# Patient Record
Sex: Female | Born: 1940
Health system: Southern US, Community
[De-identification: ages and names within clinical notes are randomized; demographics above are authoritative.]

## PROBLEM LIST (undated history)

## (undated) DIAGNOSIS — C4490 Unspecified malignant neoplasm of skin, unspecified: Secondary | ICD-10-CM

## (undated) DIAGNOSIS — G20 Parkinson's disease: Secondary | ICD-10-CM

## (undated) DIAGNOSIS — N390 Urinary tract infection, site not specified: Secondary | ICD-10-CM

## (undated) DIAGNOSIS — W101XXS Fall (on)(from) sidewalk curb, sequela: Secondary | ICD-10-CM

## (undated) DIAGNOSIS — I712 Thoracic aortic aneurysm, without rupture, unspecified: Secondary | ICD-10-CM

## (undated) DIAGNOSIS — I10 Essential (primary) hypertension: Secondary | ICD-10-CM

## (undated) DIAGNOSIS — M810 Age-related osteoporosis without current pathological fracture: Secondary | ICD-10-CM

## (undated) DIAGNOSIS — G20A1 Parkinson's disease without dyskinesia, without mention of fluctuations: Secondary | ICD-10-CM

## (undated) DIAGNOSIS — E785 Hyperlipidemia, unspecified: Secondary | ICD-10-CM

## (undated) DIAGNOSIS — K219 Gastro-esophageal reflux disease without esophagitis: Secondary | ICD-10-CM

## (undated) DIAGNOSIS — F0280 Dementia in other diseases classified elsewhere without behavioral disturbance: Secondary | ICD-10-CM

## (undated) DIAGNOSIS — I639 Cerebral infarction, unspecified: Secondary | ICD-10-CM

## (undated) DIAGNOSIS — E039 Hypothyroidism, unspecified: Secondary | ICD-10-CM

## (undated) DIAGNOSIS — E538 Deficiency of other specified B group vitamins: Secondary | ICD-10-CM

## (undated) DIAGNOSIS — E119 Type 2 diabetes mellitus without complications: Secondary | ICD-10-CM

## (undated) DIAGNOSIS — IMO0002 Reserved for concepts with insufficient information to code with codable children: Secondary | ICD-10-CM

## (undated) DIAGNOSIS — J309 Allergic rhinitis, unspecified: Secondary | ICD-10-CM

## (undated) DIAGNOSIS — M1990 Unspecified osteoarthritis, unspecified site: Secondary | ICD-10-CM

## (undated) HISTORY — DX: Parkinson's disease: G20

## (undated) HISTORY — DX: Urinary tract infection, site not specified: N39.0

## (undated) HISTORY — DX: Hypothyroidism, unspecified: E03.9

## (undated) HISTORY — DX: Allergic rhinitis, unspecified: J30.9

## (undated) HISTORY — DX: Unspecified osteoarthritis, unspecified site: M19.90

## (undated) HISTORY — DX: Dementia in other diseases classified elsewhere, unspecified severity, without behavioral disturbance, psychotic disturbance, mood disturbance, and anxiety: F02.80

## (undated) HISTORY — DX: Essential (primary) hypertension: I10

## (undated) HISTORY — DX: Cerebral infarction, unspecified: I63.9

## (undated) HISTORY — DX: Parkinson's disease without dyskinesia, without mention of fluctuations: G20.A1

## (undated) HISTORY — DX: Gastro-esophageal reflux disease without esophagitis: K21.9

## (undated) HISTORY — DX: Deficiency of other specified B group vitamins: E53.8

## (undated) HISTORY — DX: Fall (on)(from) sidewalk curb, sequela: W10.1XXS

## (undated) HISTORY — DX: Thoracic aortic aneurysm, without rupture: I71.2

## (undated) HISTORY — DX: Age-related osteoporosis without current pathological fracture: M81.0

## (undated) HISTORY — DX: Thoracic aortic aneurysm, without rupture, unspecified: I71.20

## (undated) HISTORY — DX: Type 2 diabetes mellitus without complications: E11.9

## (undated) HISTORY — DX: Unspecified malignant neoplasm of skin, unspecified: C44.90

## (undated) HISTORY — DX: Reserved for concepts with insufficient information to code with codable children: IMO0002

## (undated) HISTORY — DX: Hyperlipidemia, unspecified: E78.5

---

## 1961-10-17 HISTORY — PX: PARTIAL HYSTERECTOMY: SHX80

## 1984-10-17 HISTORY — PX: THYROIDECTOMY: SHX17

## 1999-03-30 ENCOUNTER — Ambulatory Visit (HOSPITAL_COMMUNITY): Admission: RE | Admit: 1999-03-30 | Discharge: 1999-03-30 | Payer: Self-pay | Admitting: Urology

## 2000-08-30 ENCOUNTER — Other Ambulatory Visit: Admission: RE | Admit: 2000-08-30 | Discharge: 2000-08-30 | Payer: Self-pay | Admitting: Urology

## 2001-10-17 HISTORY — PX: REPLACEMENT TOTAL KNEE: SUR1224

## 2005-10-17 HISTORY — PX: GALLBLADDER SURGERY: SHX652

## 2007-06-11 ENCOUNTER — Ambulatory Visit: Payer: Self-pay | Admitting: Vascular Surgery

## 2007-09-11 ENCOUNTER — Ambulatory Visit: Payer: Self-pay | Admitting: Vascular Surgery

## 2007-11-05 ENCOUNTER — Ambulatory Visit: Payer: Self-pay | Admitting: Vascular Surgery

## 2007-11-12 ENCOUNTER — Ambulatory Visit: Payer: Self-pay | Admitting: Vascular Surgery

## 2007-12-31 ENCOUNTER — Ambulatory Visit: Payer: Self-pay | Admitting: Vascular Surgery

## 2008-01-08 ENCOUNTER — Ambulatory Visit: Payer: Self-pay | Admitting: Vascular Surgery

## 2008-02-28 ENCOUNTER — Ambulatory Visit: Payer: Self-pay | Admitting: Vascular Surgery

## 2008-03-27 ENCOUNTER — Ambulatory Visit: Payer: Self-pay | Admitting: Vascular Surgery

## 2008-07-15 ENCOUNTER — Ambulatory Visit: Payer: Self-pay | Admitting: Vascular Surgery

## 2008-12-18 ENCOUNTER — Ambulatory Visit: Payer: Self-pay | Admitting: Internal Medicine

## 2008-12-18 DIAGNOSIS — R0609 Other forms of dyspnea: Secondary | ICD-10-CM

## 2008-12-18 DIAGNOSIS — Z8679 Personal history of other diseases of the circulatory system: Secondary | ICD-10-CM | POA: Insufficient documentation

## 2008-12-18 DIAGNOSIS — E785 Hyperlipidemia, unspecified: Secondary | ICD-10-CM

## 2008-12-18 DIAGNOSIS — I10 Essential (primary) hypertension: Secondary | ICD-10-CM | POA: Insufficient documentation

## 2008-12-18 DIAGNOSIS — E119 Type 2 diabetes mellitus without complications: Secondary | ICD-10-CM

## 2008-12-18 DIAGNOSIS — R0989 Other specified symptoms and signs involving the circulatory and respiratory systems: Secondary | ICD-10-CM

## 2009-03-27 ENCOUNTER — Ambulatory Visit: Payer: Self-pay | Admitting: Internal Medicine

## 2011-03-01 NOTE — Assessment & Plan Note (Signed)
OFFICE VISIT   TERI, LEGACY  DOB:  1940-11-05                                       11/12/2007  CHART#:[id number]   The patient is 1 week post laser ablation of her right greater saphenous  vein with greater than 20 stab phlebectomies involving the lateral calf  and medial thigh.  She has had very little discomfort in the right leg  following her procedure, but has had some mild aching, particularly in  the distal thigh over the greater saphenous vein where some bruising and  swelling has occurred.  This has not been severely tender, however.  She  has had no discomfort in the stab phlebectomy sites below the knee.  She  has had no distal edema and generally states that the leg has been very  comfortable, and she has increased her ambulation.   EXAM:  She has a moderate amount of ecchymosis over the greater  saphenous vein in the distal right thigh with some hematoma beneath this  measuring about 2 to 3 cm in diameter, but which is nontender.  There is  some mild tenderness along the course of the greater saphenous vein from  the saphenofemoral junction to the knee.  I performed a venous duplex  exam and the deep venous system is widely patent with normal flow, and  the saphenous vein is totally occluded from about 2 cm from the  saphenofemoral junction to the knee.  She is reassured regarding these  findings.  Will continue wearing her elastic compression stockings for  the next 2 weeks, and we will schedule her laser ablation procedure with  stab phlebectomies in the contralateral left leg in the near future.   Quita Skye Hart [place], M.D.  Electronically Signed   JDL/MEDQ  D:  11/12/2007  T:  11/13/2007  Job:  [id number]

## 2011-03-01 NOTE — Assessment & Plan Note (Signed)
OFFICE VISIT   BRENTNEY, GOLDBACH  DOB:  04/18/1941                                       07/15/2008  CHART#:[id number]   The patient underwent laser ablation right great saphenous vein January  2009.  Her left great saphenous vein March of 2009.  Multiple stab  phlebectomies.  She also had sclerotherapy in May and June for  telangiectasias around the distal thigh area which appeared following  the laser ablation.  The varicosities and the pain have all resolved.  She does have a few residual telangiectasias on the right distal thigh  and 1 just over the patella on the left but these are quite minor  compared with the previous photographs.  She states she is pleased  because her symptoms have been relieved and dramatically better  cosmetically.  Blood pressure 136/81, heart rate 62.  Venous duplex exam  today reveals widely patent deep venous systems.  Right great saphenous  vein is totally ablated, left great saphenous vein has some minimal flow  which does not appear significant enough to create problems in the  future.  She will return to see [place] on a p.r.n. basis.   Quita Skye Hart [place], M.D.  Electronically Signed   JDL/MEDQ  D:  07/15/2008  T:  07/16/2008  Job:  [id number]

## 2011-03-01 NOTE — Procedures (Signed)
DUPLEX DEEP VENOUS EXAM - LOWER EXTREMITY   INDICATION:  Follow up bilateral greater saphenous vein ablation.   HISTORY:  Edema:  Minimal, near knees.  Trauma/Surgery:  Left greater saphenous vein ablation with stab  phlebectomy on 12/31/07, right greater saphenous vein ablation with stab  phlebectomy on 11/05/07.  Pain:  No.  PE:  No.  Previous DVT:  No.  Anticoagulants:  No.  Other:   DUPLEX EXAM:                CFV   SFV   PopV   PTV   GSV                R  L  R  L  R  L   R  L  R  L  Thrombosis    o  o  o  o  o  o   o  o  +  +  Spontaneous   +  +  +  +  +  +   +  +  0  D  Phasic        +  +  +  +  +  +   +  +  0  D  Augmentation  +  +  +  +  +  +   +  +  0  D  Compressible  +  +  +  +  +  +   +  +  0  P  Competent     +  D  +  +  +  +   +  +  0  0   Legend:  + - yes  o - no  p - partial  D - decreased   IMPRESSION:  1. No evidence of deep venous thrombosis bilaterally.  2. Right greater saphenous vein shows evidence of ablation without      flow from groin to knee.  3. Left greater saphenous vein shows evidence of ablation with areas      of partial and total occlusion.  There is evidence of flow with      reflux; however, the flow is very minimal.    _____________________________  Deanna Woodard, M.D.   AS/MEDQ  D:  07/15/2008  T:  07/15/2008  Job:  [id number]

## 2011-03-01 NOTE — Assessment & Plan Note (Signed)
OFFICE VISIT   Deanna Woodard, Deanna Woodard  DOB:  07/03/1941                                       09/11/2007  CHART#:[id number]   [name] returns today for further follow-up regarding her  symptomatic venous insufficiency of both lower extremities.  She has  diffuse greater saphenous varicosities in both legs with symptoms of  burning, aching, throbbing and itching.  She has been wearing elastic  compression stockings since August of this year with no improvement in  her symptoms.  She does get some mild improvement with elevation of the  legs but it does not completely relieve her symptoms.  She has also  tried ibuprofen and other conservative measures.   On examination, she has excellent femoral, popliteal and dorsalis pedis  pulses in both legs with severe greater saphenous varicosities as  described along the lateral aspect of the right leg in the mid-thigh  down to the ankle and on the medial aspect of the left leg over the  greater saphenous system.  I think she would benefit from laser ablation  of the greater saphenous vein bilaterally with multiple stab  phlebectomies, and we will proceed with pre-certification for that  beginning with the right side.   Quita Skye Hart [place], M.D.  Electronically Signed   JDL/MEDQ  D:  09/11/2007  T:  09/12/2007  Job:  [id number]   cc:   Philemon Kingdom

## 2011-03-01 NOTE — Consult Note (Signed)
VASCULAR SURGERY CONSULTATION   MERON, BOCCHINO G  DOB:  1941-02-27                                       06/11/2007  CHART#:[id number]   HISTORY OF PRESENT ILLNESS:  Ms. Deanna Woodard is a 70 year old healthy female  who has had increasingly symptomatic varicose veins in both lower  extremities over the last several years.  She has noticed increasing  throbbing, aching and burning discomfort in both legs with the right and  left being equally symptomatic.  These symptoms involve the right  lateral thigh and calf as well as the left medial calf and distal thigh  area.  Her symptoms are worse while standing or sitting and slightly  relieved by elevation of the legs.  She has tried taking ibuprofen which  has helped her symptoms, particularly at night.  She also tried wearing  elastic compression stockings (short leg,) but these caused pain just  below the knee at the top of the stocking level and she was unable to  tolerate these.  She has had no bleeding or ulceration, but does have  swelling toward the end of the day in the ankles and feet and also has  noticed increasing bulging discomfort.  She feels that these are  effecting her daily living and would like treatment if at all possible.   PAST MEDICAL HISTORY:  Negative for diabetes, hypertension, coronary  artery disease, hyperlipidemia or COPD.  She does have a history of some  heart valve problems which have been followed in the past.   PAST SURGICAL HISTORY:  Bilateral knee replacement, bilateral  oophorectomy, cholecystectomy, thyroidectomy.   FAMILY HISTORY:  Positive for varicose veins in her mother, coronary  artery disease in her father who died at age 69.  Negative for stroke  and diabetes.   SOCIAL HISTORY:  She is married, has no children and is retired.  She  does not use tobacco or alcohol.   ALLERGIES:  CIPRO, CEPHALOSPORINS, MACRODANTIN, PENICILLIN, SULFA AND  TRIMETHOPRIM, all of which cause a  rash.   PHYSICAL EXAMINATION:  Vital signs:  Blood pressure 140/98, heart rate  60, respirations 14.  General:  She is a healthy appearing female in no  apparent distress, alert and oriented x3.  Neck:  Supple.  3+ carotid  pulses palpable.  No bruits are audible.  There is no palpable  adenopathy in the neck.  Neurologic exam is normal.  Upper extremity  pulses are 3+ bilaterally.  Chest:  Clear to auscultation.  Cardiovascular:  Regular rhythm with no murmurs.  Abdomen:  Soft,  nontender with no palpable masses.  Extremities:  She has 3+ femoral and  popliteal pulses palpable and 2+ dorsalis pedal pulses palpable  bilaterally.  Both legs have venous insufficiency.  On the right side  she has 1+ edema distally with prominent varicosities beginning in the  mid thigh extending down lateral to the knee and into the lateral calf  area with some smaller varicosities medially.  There is no ulceration or  infection noted.  The left leg has a large nest of bulging varicosities  in the medial calf over the greater saphenous system extending up toward  the knee and extending posteriorly into the calf area.  There is also  distal edema on the left with no hyperpigmentation or ulceration.   Venous duplex exam was performed in  our office.  There is reflux in both  greater saphenous systems with the right saphenofemoral junction having  severe reflux and the left side extending up into the proximal thigh  involving the greater saphenous system.  There is no deep venous  thrombosis or evidence of short saphenous reflux.   I think she does have symptomatic varicosities from her greater  saphenous reflux bilaterally.  We have fit her for a pair of long leg  elastic compression stockings which hopefully she will tolerate better  than the short leg stockings.  She will also try elevation, analgesics  and other conservative measures and she will return in three months to  see if there has been any  improvement in her symptomatology.  I think  she would be a good candidate for laser ablation of both greater  saphenous veins with stab phlebectomies if her conservative therapy is  not successful.   Quita Skye Hart [place], M.D.  Electronically Signed  JDL/MEDQ  D:  06/11/2007  T:  06/12/2007  Job:  [id number]   cc:   Philemon Kingdom

## 2011-03-01 NOTE — Assessment & Plan Note (Signed)
OFFICE VISIT   Deanna Woodard, Deanna Woodard  DOB:  06/30/1941                                       01/08/2008  CHART#:[id number]   The patient underwent a laser ablation of her left greater saphenous  vein with multiple stab phlebectomies on March 16.  Returns today for  initial followup.  Has had no discomfort associated with stab  phlebectomies in the calf anteriorly and posteriorly.  She has had some  mild ecchymosis.  She has had some mild discomfort along the site of the  laser ablation in the thigh and into the greater saphenous vein, which  one would expect.  She has had some discomfort associated with the  elastic on the top of the long-leg stocking.  She has had no distal  edema.   I performed a venous duplex exam today and the saphenous vein is totally  occluded from the saphenofemoral junction to the knee with normal flow  in the deep venous system, and no evidence of deep venous obstruction.  She was reassured regarding these problems.  She underwent laser  ablation of her right greater saphenous vein previously on November 05, 2007.  She has had an excellent result as far as the removal of large  bulbus varicosities, which were quite painful.  She has, however,  developed secondary reticular and spider veins in the distal thigh  medially and laterally, which were not present prior to the laser  ablation.  These are causing itching, burning, and stinging discomfort,  which is not relieved by elastic compression.  I have observed these  over the last 2 visits, and they are unchanged today.  These are painful  and have come secondary to the laser ablation procedure, and I feel that  we should proceed with attempts at sclerotherapy to eliminate these  painful areas.  These were photographed today and compared to the  preoperative photographs, and will be sent for precertification for 2  sessions of sclerotherapy in the right leg.  She will also return in 6  months for final followup venous duplex exams bilaterally.   Deanna Woodard, M.D.  Electronically Signed   JDL/MEDQ  D:  01/08/2008  T:  01/09/2008  Job:  [id number]

## 2011-03-01 NOTE — Procedures (Signed)
LOWER EXTREMITY VENOUS REFLUX EXAM   INDICATION:  Bilateral leg varicose veins and pain.   PROCEDURE PERFORMED:  Lower extremity venous exam.   EXAM:  Using color-flow imaging and pulse Doppler spectral analysis, the  right and left common femoral, superficial femoral, popliteal, posterior  tibial, greater and lesser saphenous veins are evaluated.  There is no  evidence suggesting deep venous insufficiency in the right and left  lower extremity.   The right saphenofemoral junction is not competent.  The right and left  GSV is not competent with the caliber as described below.   The right/left proximal short saphenous vein demonstrates competency.   GSV Diameter (used if found to be incompetent only)                                            Right    Left  Proximal Greater Saphenous Vein           0.62. cm 0.50. cm  Proximal-to-mid-thigh                     0.46. cm 0.52. cm  Mid thigh                                 0.46. cm 0.52. cm  Mid-distal thigh                          0.39. cm 0.55. cm  Distal thigh                              0.37. cm 0.55. cm  Knee                                      0.37. cm 0.58. cm   IMPRESSION:  1. Right and left greater saphenous vein reflux is identified with the      caliber ranging from on the right, 0.37 to 0.62 cm knee to groin,      on the left, 0.58 to 0.50 knee to groin.  2. The right and left greater saphenous vein is not aneurysmal.  3. The right and left greater saphenous vein is not tortuous.  4. There is no evidence of significant deep venous insufficiency in      the left lower extremity.  5. The deep venous system is competent.  6. The right and left lesser saphenous vein is competent.  7. There is no evidence of DVT noted in bilateral legs.   ___________________________________________  Quita Skye Hart [place], M.D.   MG/MEDQ  D:  06/11/2007  T:  06/12/2007  Job:  [id number]

## 2014-05-20 ENCOUNTER — Institutional Professional Consult (permissible substitution) (INDEPENDENT_AMBULATORY_CARE_PROVIDER_SITE_OTHER): Payer: Medicare PPO | Admitting: Thoracic Surgery (Cardiothoracic Vascular Surgery)

## 2014-05-20 VITALS — BP 139/79 | HR 60 | Ht 66.0 in | Wt 184.0 lb

## 2014-05-20 DIAGNOSIS — I712 Thoracic aortic aneurysm, without rupture, unspecified: Secondary | ICD-10-CM

## 2014-05-20 DIAGNOSIS — I7121 Aneurysm of the ascending aorta, without rupture: Secondary | ICD-10-CM | POA: Insufficient documentation

## 2014-05-20 NOTE — Progress Notes (Signed)
PCP is PROCHNAU,CAROLINE, MD Referring Provider is Ernestene Kiel, MD  Chief Complaint  Patient presents with  . NEW THORACIC    THORACIC ANEURYSM W/O MENTION OF RUPTURE    HPI: 73 year old woman sent for consultation regarding an ascending thoracic aneurysm.  Deanna Woodard is a 73 year old woman with a history of hypertension, hypercholesterolemia, permanent pacemaker, diabetes, and a "mini stroke." She says that about 6 months ago she was having some chest pains. She says her workup for that was negative for cardiac etiology. She did have a CT at that time to rule out pulmonary embolus. There was no PE. She was noted to have mild diffuse enlargement of her aorta. She had another CT in July which she says was in followup of the previous one. This described a 4 cm ascending thoracic aortic aneurysm.  She says that she is not currently having any issues with chest pain or shortness of breath. She does have leg swelling. She does have some residual expressive aphasia from her "mini stroke" and also has had some memory problems.   Past Medical History  Diagnosis Date  . Hypertension   . Hyperlipidemia   . Hypothyroidism   . Diabetes mellitus, type 2   . Thoracic aneurysm without mention of rupture   . Stroke   . Skin cancer   . UTI (lower urinary tract infection)   . GERD (gastroesophageal reflux disease)   . DDD (degenerative disc disease)   . DJD (degenerative joint disease)   . Vitamin B12 deficiency   . Allergic rhinitis   . Osteoporosis     Past Surgical History  Procedure Laterality Date  . Gallbladder surgery  2007  . Thyroidectomy  1986  . Partial hysterectomy  1963  . Replacement total knee  2003    Family History  Problem Relation Age of Onset  . Hypertension Mother   . Hypertension Father   . Heart disease Father   . Hypertension Sister   . Hiatal hernia Sister   . Coronary artery disease Sister     Social History History  Substance Use Topics  .  Smoking status: Never Smoker   . Smokeless tobacco: Never Used  . Alcohol Use: No    Current Outpatient Prescriptions  Medication Sig Dispense Refill  . aspirin EC 81 MG tablet Take 81 mg by mouth daily.      . Calcium Carb-Cholecalciferol (CALCIUM 600 + D) 600-200 MG-UNIT TABS Take by mouth.      . cholecalciferol (VITAMIN D) 1000 UNITS tablet Take 1,000 Units by mouth daily.      . furosemide (LASIX) 20 MG tablet Take 20 mg by mouth.      Marland Kitchen HYDROcodone-acetaminophen (NORCO/VICODIN) 5-325 MG per tablet Take 1 tablet by mouth every 6 (six) hours as needed for moderate pain.      Marland Kitchen levothyroxine (SYNTHROID, LEVOTHROID) 100 MCG tablet Take 100 mcg by mouth daily before breakfast.      . losartan (COZAAR) 50 MG tablet Take 50 mg by mouth daily.      . metoprolol succinate (TOPROL-XL) 50 MG 24 hr tablet Take 50 mg by mouth daily. Take with or immediately following a meal.      . naproxen sodium (ANAPROX) 220 MG tablet Take 220 mg by mouth 2 (two) times daily with a meal.      . omeprazole (PRILOSEC) 20 MG capsule Take 20 mg by mouth daily.      . pravastatin (PRAVACHOL) 10 MG tablet Take 10 mg  by mouth daily.      Marland Kitchen triamcinolone cream (KENALOG) 0.1 % Apply 1 application topically 2 (two) times daily.       No current facility-administered medications for this visit.    Allergies  Allergen Reactions  . Penicillins     REACTION: rash  . Sulfonamide Derivatives     REACTION: rash    Review of Systems  Constitutional: Negative for activity change.  Eyes:       Blurry vision  Respiratory: Negative for cough and shortness of breath.   Cardiovascular: Positive for chest pain (6 months ago, none recently), palpitations (permanent pacemaker placed during the ablation procedure) and leg swelling.       Varicose veins, status post ablation  Musculoskeletal:       Leg cramps  Neurological: Negative for weakness.       Memory problems, mild expressive aphasia  All other systems reviewed and  are negative.   BP 139/79  Pulse 60  Ht 5\' 6"  (1.676 m)  Wt 184 lb (83.462 kg)  BMI 29.71 kg/m2  SpO2 97% Physical Exam  Vitals reviewed. Constitutional: She is oriented to person, place, and time. She appears well-developed and well-nourished. No distress.  HENT:  Head: Normocephalic and atraumatic.  Eyes: EOM are normal. Pupils are equal, round, and reactive to light.  Neck: Neck supple. No thyromegaly present.  No carotid bruits  Cardiovascular: Normal rate and normal heart sounds.  Exam reveals no gallop and no friction rub.   No murmur heard. Slightly irregular rhythm  Pulmonary/Chest: Effort normal and breath sounds normal. She has no wheezes. She has no rales.  Abdominal: Soft. There is no tenderness.  Musculoskeletal: She exhibits edema (1+ bilaterally).  Lymphadenopathy:    She has no cervical adenopathy.  Neurological: She is alert and oriented to person, place, and time. No cranial nerve deficit.  Mild expressive aphasia  Skin: Skin is warm and dry.     Diagnostic Tests: CT ANGIOGRAPHY CHEST  CT ABDOMEN AND PELVIS WITH CONTRAST  TECHNIQUE: Multidetector CT imaging of the chest was performed using the standard protocol during bolus administration of intravenous contrast. Multiplanar CT image reconstructions and MIPs were obtained to evaluate the vascular anatomy. Multidetector CT imaging of the abdomen and pelvis was performed using the standard protocol during bolus administration of intravenous contrast.  CONTRAST: 100 cc of Isovue 370  COMPARISON: None.  FINDINGS: CTA CHEST FINDINGS  There is no pleural effusion identified. No airspace consolidation or atelectasis identified. Small nodule in the right lower lobe measures 3 mm, image 59/series 8.  There is mild cardiac enlargement. No pericardial effusion. Left chest wall pacer device is identified with leads in the right atrial appendage and right ventricle. No pathologically enlarged mediastinal  or hilar lymph nodes identified. There is no axillary or supraclavicular adenopathy. The ascending thoracic aorta measures 4 cm in diameter, image 110/series 3. At the level of the aortic arch the thoracic aorta measures 2.5 cm. The descending thoracic aorta measures up to 2.3 cm.  That the bones appear osteopenic. Mild compression fractures are identified at T6 and T10. There is mild multi level disc space narrowing and ventral endplate spurring noted. No aggressive appearing the lytic or sclerotic bone lesions identified.  CT ABDOMEN and PELVIS FINDINGS  There is no focal liver abnormality identified. Previous cholecystectomy. No significant biliary dilatation. Normal appearance of the pancreas. The spleen is normal.  Normal appearance of the right adrenal gland. No nodule. Similar appearance of mild asymmetric  enlargement of the left adrenal gland without discrete nodule or mass. Bilateral pelvocaliectasis noted. This appears similar to previous exam. The kidneys are otherwise unremarkable.  Normal caliber of the abdominal aorta. There is mild calcified atherosclerotic disease noted. No aneurysm. No upper abdominal retroperitoneal adenopathy. No enlarged mesenteric nodes.  The stomach is within normal limits. The small bowel loops appear normal. Normal appearance of the colon.  Review of the visualized osseous structures is significant for mild multilevel degenerative disc disease. This is most severe at L1-2.  Review of the MIP images confirms the above findings.  IMPRESSION: 1. Mild aneurysmal dilatation of the ascending aorta which measures 4 cm. 2. Right lower lobe pulmonary nodule measures 3 mm. If the patient is at high risk for bronchogenic carcinoma, follow-up chest CT at 1 year is recommended. If the patient is at low risk, no follow-up is needed. This recommendation follows the consensus statement: Guidelines for Management of Small Pulmonary Nodules Detected on  CT Scans: A Statement from the [employer] as published in Radiology 2005; 237:395-400. 3. Mild T6 and T7 compression deformities. 4. Thoracic and lumbar degenerative disc disease. 5. Atherosclerotic disease. 6. Stable mild asymmetric enlargement of the left adrenal gland.   Electronically Signed By: Kerby Moors M.D. On: 05/06/2014 13:44   Impression: 73 year old woman with a history of hypertension, hypercholesterolemia, diabetes who was incidentally found to have a 4 cm descending aorta on the CT back in January. A followup CT in 6 months shows no change in the size of the aorta. Interestingly the radiologist in January so diffuse aortic enlargement and the radiologist in July says aneurysm. Comparison to study side-by-side there is no difference.  There is no indication for surgery. Typically in the ascending aorta we recommend surgery when the aneurysm gets to about 5.5 cm in diameter.  She also questions about the etiology of the aneurysm and the things that she could do to try to prevent it from expanding. I discussed the importance of blood pressure control with her. Her blood pressure is borderline elevated today, which is not surprising given her anxiety about the diagnosis. She is on a good medical regimen including metoprolol and Cozaar.  Her ascending aorta is definitely enlarged and borderline aneurysmal. I think she can safely be followed on an annual basis for now. Given that the radiologist also noted a 3 mm lung nodule in the right lower lobe will plan to follow her with CTs initially.  Plan: Return with CT angio of chest in one year

## 2014-10-17 DIAGNOSIS — W101XXS Fall (on)(from) sidewalk curb, sequela: Secondary | ICD-10-CM

## 2014-10-17 HISTORY — DX: Fall (on)(from) sidewalk curb, sequela: W10.1XXS

## 2014-12-22 DIAGNOSIS — Z961 Presence of intraocular lens: Secondary | ICD-10-CM | POA: Diagnosis not present

## 2015-02-11 DIAGNOSIS — S92352A Displaced fracture of fifth metatarsal bone, left foot, initial encounter for closed fracture: Secondary | ICD-10-CM | POA: Diagnosis not present

## 2015-02-11 DIAGNOSIS — S20212A Contusion of left front wall of thorax, initial encounter: Secondary | ICD-10-CM | POA: Diagnosis not present

## 2015-02-11 DIAGNOSIS — I10 Essential (primary) hypertension: Secondary | ICD-10-CM | POA: Diagnosis not present

## 2015-02-11 DIAGNOSIS — Z7901 Long term (current) use of anticoagulants: Secondary | ICD-10-CM | POA: Diagnosis not present

## 2015-02-11 DIAGNOSIS — S5002XA Contusion of left elbow, initial encounter: Secondary | ICD-10-CM | POA: Diagnosis not present

## 2015-02-11 DIAGNOSIS — Z79899 Other long term (current) drug therapy: Secondary | ICD-10-CM | POA: Diagnosis not present

## 2015-04-06 DIAGNOSIS — R4701 Aphasia: Secondary | ICD-10-CM | POA: Diagnosis not present

## 2015-04-06 DIAGNOSIS — R41841 Cognitive communication deficit: Secondary | ICD-10-CM | POA: Diagnosis not present

## 2015-04-06 DIAGNOSIS — F800 Phonological disorder: Secondary | ICD-10-CM | POA: Diagnosis not present

## 2015-04-10 DIAGNOSIS — R41841 Cognitive communication deficit: Secondary | ICD-10-CM | POA: Diagnosis not present

## 2015-04-10 DIAGNOSIS — F800 Phonological disorder: Secondary | ICD-10-CM | POA: Diagnosis not present

## 2015-04-10 DIAGNOSIS — R4701 Aphasia: Secondary | ICD-10-CM | POA: Diagnosis not present

## 2015-04-27 ENCOUNTER — Other Ambulatory Visit: Payer: Self-pay | Admitting: *Deleted

## 2015-04-27 DIAGNOSIS — I719 Aortic aneurysm of unspecified site, without rupture: Secondary | ICD-10-CM

## 2015-04-27 DIAGNOSIS — R911 Solitary pulmonary nodule: Secondary | ICD-10-CM

## 2015-04-29 DIAGNOSIS — S92352A Displaced fracture of fifth metatarsal bone, left foot, initial encounter for closed fracture: Secondary | ICD-10-CM | POA: Diagnosis not present

## 2015-05-01 DIAGNOSIS — M8589 Other specified disorders of bone density and structure, multiple sites: Secondary | ICD-10-CM | POA: Diagnosis not present

## 2015-05-01 DIAGNOSIS — M810 Age-related osteoporosis without current pathological fracture: Secondary | ICD-10-CM | POA: Diagnosis not present

## 2015-05-01 DIAGNOSIS — S92902A Unspecified fracture of left foot, initial encounter for closed fracture: Secondary | ICD-10-CM | POA: Diagnosis not present

## 2015-05-07 DIAGNOSIS — E039 Hypothyroidism, unspecified: Secondary | ICD-10-CM | POA: Diagnosis not present

## 2015-05-07 DIAGNOSIS — E119 Type 2 diabetes mellitus without complications: Secondary | ICD-10-CM | POA: Diagnosis not present

## 2015-05-07 DIAGNOSIS — I10 Essential (primary) hypertension: Secondary | ICD-10-CM | POA: Diagnosis not present

## 2015-05-07 DIAGNOSIS — E785 Hyperlipidemia, unspecified: Secondary | ICD-10-CM | POA: Diagnosis not present

## 2015-05-07 DIAGNOSIS — K219 Gastro-esophageal reflux disease without esophagitis: Secondary | ICD-10-CM | POA: Diagnosis not present

## 2015-05-07 DIAGNOSIS — I480 Paroxysmal atrial fibrillation: Secondary | ICD-10-CM | POA: Diagnosis not present

## 2015-05-18 DIAGNOSIS — I719 Aortic aneurysm of unspecified site, without rupture: Secondary | ICD-10-CM | POA: Diagnosis not present

## 2015-05-18 DIAGNOSIS — R911 Solitary pulmonary nodule: Secondary | ICD-10-CM | POA: Diagnosis not present

## 2015-05-18 LAB — BUN: BUN: 20 mg/dL (ref 7–25)

## 2015-05-18 LAB — CREATININE, SERUM: Creat: 1 mg/dL — ABNORMAL HIGH (ref 0.60–0.93)

## 2015-05-19 DIAGNOSIS — F800 Phonological disorder: Secondary | ICD-10-CM | POA: Diagnosis not present

## 2015-05-19 DIAGNOSIS — R4701 Aphasia: Secondary | ICD-10-CM | POA: Diagnosis not present

## 2015-05-19 DIAGNOSIS — R41841 Cognitive communication deficit: Secondary | ICD-10-CM | POA: Diagnosis not present

## 2015-05-25 DIAGNOSIS — H02831 Dermatochalasis of right upper eyelid: Secondary | ICD-10-CM | POA: Diagnosis not present

## 2015-05-25 DIAGNOSIS — H04123 Dry eye syndrome of bilateral lacrimal glands: Secondary | ICD-10-CM | POA: Diagnosis not present

## 2015-05-25 DIAGNOSIS — Z961 Presence of intraocular lens: Secondary | ICD-10-CM | POA: Diagnosis not present

## 2015-05-25 DIAGNOSIS — H02834 Dermatochalasis of left upper eyelid: Secondary | ICD-10-CM | POA: Diagnosis not present

## 2015-05-26 ENCOUNTER — Encounter: Payer: Self-pay | Admitting: Thoracic Surgery (Cardiothoracic Vascular Surgery)

## 2015-05-26 ENCOUNTER — Ambulatory Visit
Admission: RE | Admit: 2015-05-26 | Discharge: 2015-05-26 | Disposition: A | Discharge status: Home / Self Care | Payer: Medicare PPO | Source: Ambulatory Visit | Attending: Thoracic Surgery (Cardiothoracic Vascular Surgery) | Admitting: Thoracic Surgery (Cardiothoracic Vascular Surgery)

## 2015-05-26 ENCOUNTER — Ambulatory Visit (INDEPENDENT_AMBULATORY_CARE_PROVIDER_SITE_OTHER): Payer: Medicare PPO | Admitting: Thoracic Surgery (Cardiothoracic Vascular Surgery)

## 2015-05-26 VITALS — BP 145/79 | HR 60 | Resp 20 | Ht 66.0 in | Wt 186.0 lb

## 2015-05-26 DIAGNOSIS — F800 Phonological disorder: Secondary | ICD-10-CM | POA: Diagnosis not present

## 2015-05-26 DIAGNOSIS — R911 Solitary pulmonary nodule: Secondary | ICD-10-CM

## 2015-05-26 DIAGNOSIS — I712 Thoracic aortic aneurysm, without rupture: Secondary | ICD-10-CM

## 2015-05-26 DIAGNOSIS — I719 Aortic aneurysm of unspecified site, without rupture: Secondary | ICD-10-CM

## 2015-05-26 DIAGNOSIS — I7121 Aneurysm of the ascending aorta, without rupture: Secondary | ICD-10-CM

## 2015-05-26 DIAGNOSIS — I700 Atherosclerosis of aorta: Secondary | ICD-10-CM | POA: Diagnosis not present

## 2015-05-26 DIAGNOSIS — R41841 Cognitive communication deficit: Secondary | ICD-10-CM | POA: Diagnosis not present

## 2015-05-26 DIAGNOSIS — R4701 Aphasia: Secondary | ICD-10-CM | POA: Diagnosis not present

## 2015-05-26 MED ORDER — IOPAMIDOL (ISOVUE-370) INJECTION 76%
75.0000 mL | Freq: Once | INTRAVENOUS | Status: AC | PRN
Start: 2015-05-26 — End: 2015-05-26
  Administered 2015-05-26: 75 mL via INTRAVENOUS

## 2015-05-26 NOTE — Progress Notes (Signed)
[address]       [place] [address]             [phone]      HPI: Deanna Woodard returns for a scheduled 1 year follow-up visit.  She is a 74 year old woman with hypertension, hyperlipidemia, type 2 diabetes, atrial fibrillation, and expressive aphasia from a stroke. She was found to have a 4 cm ascending aortic aneurysm about a year ago. She was asymptomatic and we recommended repeat scan in 1 year. She now returns for that.  She has been having more difficulty with her expressive aphasia recently. She's been working with a therapist on that, but is not seeing much progress. She has an appointment with a neurologist in the next couple of weeks. She wore an event monitor for a month and now is on Eliquis for atrial fibrillation. She has not had any chest pain, but does get short of breath with moderate exertion.  Past Medical History  Diagnosis Date  . Hypertension   . Hyperlipidemia   . Hypothyroidism   . Diabetes mellitus, type 2   . Thoracic aneurysm without mention of rupture   . Stroke   . Skin cancer   . UTI (lower urinary tract infection)   . GERD (gastroesophageal reflux disease)   . DDD (degenerative disc disease)   . DJD (degenerative joint disease)   . Vitamin B12 deficiency   . Allergic rhinitis   . Osteoporosis       Current Outpatient Prescriptions  Medication Sig Dispense Refill  . apixaban (ELIQUIS) 5 MG TABS tablet Take 5 mg by mouth 2 (two) times daily.    . cholecalciferol (VITAMIN D) 1000 UNITS tablet Take 1,000 Units by mouth daily.    . furosemide (LASIX) 20 MG tablet Take 20 mg by mouth.    . levothyroxine (SYNTHROID, LEVOTHROID) 100 MCG tablet Take 100 mcg by mouth daily before breakfast.    . losartan (COZAAR) 50 MG tablet Take 50 mg by mouth daily.    . metoprolol succinate (TOPROL-XL) 50 MG 24 hr tablet Take 50 mg by mouth daily. Take with or immediately following a meal.    . omeprazole (PRILOSEC) 20 MG capsule Take 20  mg by mouth daily.    . pravastatin (PRAVACHOL) 10 MG tablet Take 10 mg by mouth daily.     No current facility-administered medications for this visit.    Physical Exam BP 145/79 mmHg  Pulse 60  Resp 20  Ht 5\' 6"  (1.676 m)  Wt 186 lb (84.369 kg)  BMI 30.04 kg/m2  SpO89 64% 74 year old woman in no acute distress Well-developed, obese Alert and oriented 3, expressive aphasia No carotid bruits Cardiac irregularly irregular, no murmur Lungs clear with breath sounds bilaterally Pulses 2+ and symmetrical  Diagnostic Tests: CT ANGIOGRAPHY CHEST WITH CONTRAST  TECHNIQUE: Multidetector CT imaging of the chest was performed using the standard protocol during bolus administration of intravenous contrast. Multiplanar CT image reconstructions and MIPs were obtained to evaluate the vascular anatomy.  CONTRAST: 75 mL of Isovue 370 intravenously.  COMPARISON: CT scan of May 06, 2014.  FINDINGS: Stable old compression deformities are noted in the thoracic spine. No pneumothorax or pleural effusion is noted. Stable 3 mm nodule is noted in the lateral portion of the right lower lobe. Mild biapical scarring is noted. No acute pulmonary disease is noted.  4.1 cm ascending thoracic aortic aneurysm is noted which is not significantly changed compared to prior  exam. Great vessels are widely patent without significant stenosis. No dissection is noted. Atherosclerosis of thoracic aorta is noted. Significant mediastinal adenopathy is noted. Enlarged right thyroid gland is noted. Visualized portion of upper abdomen appears normal.  Review of the MIP images confirms the above findings.  IMPRESSION: Stable 4.1 cm ascending thoracic aortic aneurysm. Recommend annual imaging followup by CTA or MRA. This recommendation follows 2010 ACCF/AHA/AATS/ACR/ASA/SCA/SCAI/SIR/STS/SVM Guidelines for the Diagnosis and Management of Patients with Thoracic Aortic Disease. Circulation. 2010; 121:  W929-V747.  Stable 3 mm nodule is noted in right lower lobe. This can be considered benign at this point with no further follow-up required.   Electronically Signed  By: Deanna Woodard, M.D.  On: 05/26/2015 14:24  Impression: 74 year old woman with multiple medical issues who has a 4.1 cm ascending aortic aneurysm. This is stable over the past year. There is no indication for surgical intervention at this time. She does need continued follow-up.  She also has a 3 mm nodule in the right lower lobe that is unchanged over the past year.  Her blood pressure was mildly elevated today at 145/79. It was normal 2 weeks ago when she saw Dr. Laqueta Due. I suspect that it was elevated today due to going for a CT and then rushing to the office. I did not make any medication changes  Plan:  Follow-up with Dr. Laqueta Due regarding BP  I will see her back in one year with a CT angiogram of the chest.  Deanna Nakayama, MD [facility] [phone]

## 2015-06-04 DIAGNOSIS — F800 Phonological disorder: Secondary | ICD-10-CM | POA: Diagnosis not present

## 2015-06-09 DIAGNOSIS — E785 Hyperlipidemia, unspecified: Secondary | ICD-10-CM | POA: Diagnosis not present

## 2015-06-09 DIAGNOSIS — Z79899 Other long term (current) drug therapy: Secondary | ICD-10-CM | POA: Diagnosis not present

## 2015-06-09 DIAGNOSIS — R4701 Aphasia: Secondary | ICD-10-CM | POA: Diagnosis not present

## 2015-06-09 DIAGNOSIS — R41841 Cognitive communication deficit: Secondary | ICD-10-CM | POA: Diagnosis not present

## 2015-06-09 DIAGNOSIS — I10 Essential (primary) hypertension: Secondary | ICD-10-CM | POA: Diagnosis not present

## 2015-06-09 DIAGNOSIS — E119 Type 2 diabetes mellitus without complications: Secondary | ICD-10-CM | POA: Diagnosis not present

## 2015-06-09 DIAGNOSIS — F800 Phonological disorder: Secondary | ICD-10-CM | POA: Diagnosis not present

## 2015-06-10 DIAGNOSIS — G311 Senile degeneration of brain, not elsewhere classified: Secondary | ICD-10-CM | POA: Diagnosis not present

## 2015-06-10 DIAGNOSIS — R479 Unspecified speech disturbances: Secondary | ICD-10-CM | POA: Diagnosis not present

## 2015-06-10 DIAGNOSIS — R4789 Other speech disturbances: Secondary | ICD-10-CM | POA: Diagnosis not present

## 2015-06-16 DIAGNOSIS — R4701 Aphasia: Secondary | ICD-10-CM | POA: Diagnosis not present

## 2015-06-16 DIAGNOSIS — F800 Phonological disorder: Secondary | ICD-10-CM | POA: Diagnosis not present

## 2015-06-16 DIAGNOSIS — R41841 Cognitive communication deficit: Secondary | ICD-10-CM | POA: Diagnosis not present

## 2015-06-17 DIAGNOSIS — F800 Phonological disorder: Secondary | ICD-10-CM | POA: Diagnosis not present

## 2015-06-18 DIAGNOSIS — R4701 Aphasia: Secondary | ICD-10-CM | POA: Diagnosis not present

## 2015-06-18 DIAGNOSIS — R41841 Cognitive communication deficit: Secondary | ICD-10-CM | POA: Diagnosis not present

## 2015-06-23 DIAGNOSIS — D510 Vitamin B12 deficiency anemia due to intrinsic factor deficiency: Secondary | ICD-10-CM | POA: Diagnosis not present

## 2015-06-23 DIAGNOSIS — R41841 Cognitive communication deficit: Secondary | ICD-10-CM | POA: Diagnosis not present

## 2015-06-23 DIAGNOSIS — R800 Isolated proteinuria: Secondary | ICD-10-CM | POA: Diagnosis not present

## 2015-06-23 DIAGNOSIS — R5383 Other fatigue: Secondary | ICD-10-CM | POA: Diagnosis not present

## 2015-06-23 DIAGNOSIS — G609 Hereditary and idiopathic neuropathy, unspecified: Secondary | ICD-10-CM | POA: Diagnosis not present

## 2015-06-23 DIAGNOSIS — R4701 Aphasia: Secondary | ICD-10-CM | POA: Diagnosis not present

## 2015-06-23 DIAGNOSIS — F800 Phonological disorder: Secondary | ICD-10-CM | POA: Diagnosis not present

## 2015-06-23 DIAGNOSIS — A539 Syphilis, unspecified: Secondary | ICD-10-CM | POA: Diagnosis not present

## 2015-06-29 DIAGNOSIS — S92352A Displaced fracture of fifth metatarsal bone, left foot, initial encounter for closed fracture: Secondary | ICD-10-CM | POA: Diagnosis not present

## 2015-07-01 DIAGNOSIS — F800 Phonological disorder: Secondary | ICD-10-CM | POA: Diagnosis not present

## 2015-08-24 DIAGNOSIS — R4701 Aphasia: Secondary | ICD-10-CM | POA: Diagnosis not present

## 2015-08-24 DIAGNOSIS — M7989 Other specified soft tissue disorders: Secondary | ICD-10-CM | POA: Diagnosis not present

## 2015-08-25 DIAGNOSIS — Z471 Aftercare following joint replacement surgery: Secondary | ICD-10-CM | POA: Diagnosis not present

## 2015-08-25 DIAGNOSIS — Z96653 Presence of artificial knee joint, bilateral: Secondary | ICD-10-CM | POA: Diagnosis not present

## 2015-08-31 DIAGNOSIS — M7989 Other specified soft tissue disorders: Secondary | ICD-10-CM | POA: Diagnosis not present

## 2015-08-31 DIAGNOSIS — E119 Type 2 diabetes mellitus without complications: Secondary | ICD-10-CM | POA: Diagnosis not present

## 2015-08-31 DIAGNOSIS — I4891 Unspecified atrial fibrillation: Secondary | ICD-10-CM | POA: Diagnosis not present

## 2015-08-31 DIAGNOSIS — Z7901 Long term (current) use of anticoagulants: Secondary | ICD-10-CM | POA: Diagnosis not present

## 2015-08-31 DIAGNOSIS — I872 Venous insufficiency (chronic) (peripheral): Secondary | ICD-10-CM | POA: Diagnosis not present

## 2015-08-31 DIAGNOSIS — I10 Essential (primary) hypertension: Secondary | ICD-10-CM | POA: Diagnosis not present

## 2015-08-31 DIAGNOSIS — Z950 Presence of cardiac pacemaker: Secondary | ICD-10-CM | POA: Diagnosis not present

## 2015-09-07 DIAGNOSIS — R4701 Aphasia: Secondary | ICD-10-CM | POA: Diagnosis not present

## 2015-09-07 DIAGNOSIS — I480 Paroxysmal atrial fibrillation: Secondary | ICD-10-CM | POA: Diagnosis not present

## 2015-09-07 DIAGNOSIS — E785 Hyperlipidemia, unspecified: Secondary | ICD-10-CM | POA: Diagnosis not present

## 2015-09-07 DIAGNOSIS — E119 Type 2 diabetes mellitus without complications: Secondary | ICD-10-CM | POA: Diagnosis not present

## 2015-09-07 DIAGNOSIS — Z79899 Other long term (current) drug therapy: Secondary | ICD-10-CM | POA: Diagnosis not present

## 2015-09-07 DIAGNOSIS — E039 Hypothyroidism, unspecified: Secondary | ICD-10-CM | POA: Diagnosis not present

## 2015-09-07 DIAGNOSIS — I10 Essential (primary) hypertension: Secondary | ICD-10-CM | POA: Diagnosis not present

## 2015-11-06 ENCOUNTER — Encounter: Payer: Self-pay | Admitting: Vascular Surgery

## 2015-11-09 ENCOUNTER — Encounter: Payer: Self-pay | Admitting: Vascular Surgery

## 2015-11-17 ENCOUNTER — Encounter: Payer: Self-pay | Admitting: Vascular Surgery

## 2015-11-17 ENCOUNTER — Ambulatory Visit (INDEPENDENT_AMBULATORY_CARE_PROVIDER_SITE_OTHER): Payer: Medicare Other | Admitting: Vascular Surgery

## 2015-11-17 VITALS — BP 139/79 | HR 60 | Temp 97.6°F | Resp 16 | Ht 68.0 in | Wt 184.0 lb

## 2015-11-17 DIAGNOSIS — I83892 Varicose veins of left lower extremities with other complications: Secondary | ICD-10-CM | POA: Diagnosis not present

## 2015-11-17 NOTE — Progress Notes (Signed)
Subjective:     Patient ID: Deanna Woodard, female   DOB: September 03, 1941, 75 y.o.   MRN: [id number]  HPI this 75 year old female is known to me having previously undergone bilateral great saphenous vein laser ablation procedures in 2009 for painful varicosities. She has done well since that time with residual varicosities being minimal. Unfortunately she began developing progressive edema in the left leg about 18 months ago. She also had a fracture of her left ankle about 9 months ago which did not require surgery. She has no history of DVT thrombophlebitis stasis ulcers or bleeding. She is not wearing elastic compression stockings. She is developing aching throbbing and burning discomfort in the left leg which worsens as the day progresses and her edema worsens as well. Right leg is not having much in the way of symptoms.  Past Medical History  Diagnosis Date  . Hypertension   . Hyperlipidemia   . Hypothyroidism   . Diabetes mellitus, type 2 ([place])   . Thoracic aneurysm without mention of rupture   . Stroke ([place])   . Skin cancer   . UTI (lower urinary tract infection)   . GERD (gastroesophageal reflux disease)   . DDD (degenerative disc disease)   . DJD (degenerative joint disease)   . Vitamin B12 deficiency   . Allergic rhinitis   . Osteoporosis   . Fall (on)(from) sidewalk curb, sequela 2016    Fx Left foot    Social History  Substance Use Topics  . Smoking status: Never Smoker   . Smokeless tobacco: Never Used  . Alcohol Use: No    Family History  Problem Relation Age of Onset  . Hypertension Mother   . Hypertension Father   . Heart disease Father   . Hypertension Sister   . Hiatal hernia Sister   . Coronary artery disease Sister     Allergies  Allergen Reactions  . Penicillins     REACTION: rash  . Sulfonamide Derivatives     REACTION: rash     Current outpatient prescriptions:  .  apixaban (ELIQUIS) 5 MG TABS tablet, Take 5 mg by mouth 2 (two) times daily., Disp:  , Rfl:  .  cholecalciferol (VITAMIN D) 1000 UNITS tablet, Take 1,000 Units by mouth daily., Disp: , Rfl:  .  doxycycline (PERIOSTAT) 20 MG tablet, Take 20 mg by mouth 2 (two) times daily with a meal., Disp: , Rfl: 5 .  furosemide (LASIX) 20 MG tablet, Take 20 mg by mouth., Disp: , Rfl:  .  levothyroxine (SYNTHROID, LEVOTHROID) 100 MCG tablet, Take 100 mcg by mouth daily before breakfast., Disp: , Rfl:  .  losartan (COZAAR) 50 MG tablet, Take 50 mg by mouth daily., Disp: , Rfl:  .  metoprolol succinate (TOPROL-XL) 50 MG 24 hr tablet, Take 50 mg by mouth daily. Take with or immediately following a meal., Disp: , Rfl:  .  omeprazole (PRILOSEC) 20 MG capsule, Take 20 mg by mouth daily., Disp: , Rfl:  .  pravastatin (PRAVACHOL) 10 MG tablet, Take 10 mg by mouth daily., Disp: , Rfl:   Filed Vitals:   11/17/15 0959  BP: 139/79  Pulse: 60  Temp: 97.6 F (36.4 C)  TempSrc: Oral  Resp: 16  Height: 5\' 8"  (1.727 m)  Weight: 184 lb (83.462 kg)  SpO2: 97%    Body mass index is 27.98 kg/(m^2).           Review of Systems denies chest, dyspnea on exertion, PND, orthopnea. Has had  problems with expressive aphasia evaluated at [facility]. Also has had intermittent atrial fibrillation and currently on Eliquis. History of fractured left ankle as noted in history of present illness.       Objective:   Physical Exam BP 139/79 mmHg  Pulse 60  Temp(Src) 97.6 F (36.4 C) (Oral)  Resp 16  Ht 5\' 8"  (1.727 m)  Wt 184 lb (83.462 kg)  BMI 27.98 kg/m2  SpO2 97%  Gen.-alert and oriented x3 in no apparent distress HEENT normal for age Lungs no rhonchi or wheezing Cardiovascular regular rhythm no murmurs carotid pulses 3+ palpable no bruits audible Abdomen soft nontender no palpable masses Musculoskeletal free of  major deformities Skin clear -no rashes Neurologic normal Lower extremities 3+ femoral and dorsalis pedis pulses palpable bilaterally with no edema on the right 1+ edema left  leg beginning below the knee. Network of reticular and spider veins in left medial and lateral malleolar areas. No active ulcer noted. No hyperpigmentation noted.  A recent venous reflux study was performed at [facility] I have thoroughly reviewed this report. I have also performed a bedside sinus site ultrasound exam to confirm these findings. The right leg had successful ablation of the great saphenous vein down to the knee with no DVT noted. The left leg has had recanalization of the left great saphenous vein which is a large caliber vein from the saphenofemoral junction down to below the knee and has gross reflux throughout with no DVT Bilateral small saphenous veins have no reflux       Assessment:     Pain and swelling left leg due to gross reflux left great saphenous vein with worsening of edema and symptoms which are affecting patient's daily living-aching throbbing and burning discomfort progressive through the day    Plan:         #1 long leg elastic compression stockings 20-30 mm gradient #2 elevate legs as much as possible #3 ibuprofen daily on a regular basis for pain #4 return in 3 months-if no significant improvement then she will need laser ablation left great saphenous vein. Return in 3 months She will need to have herEliquis discontinued about 4 days prior to the procedure and then resumed about 3 days following the procedure.

## 2015-11-19 ENCOUNTER — Encounter: Payer: Self-pay | Admitting: Internal Medicine

## 2016-02-11 ENCOUNTER — Encounter: Payer: Self-pay | Admitting: Vascular Surgery

## 2016-02-16 ENCOUNTER — Other Ambulatory Visit: Payer: Self-pay | Admitting: *Deleted

## 2016-02-16 ENCOUNTER — Encounter: Payer: Self-pay | Admitting: Vascular Surgery

## 2016-02-16 ENCOUNTER — Ambulatory Visit (INDEPENDENT_AMBULATORY_CARE_PROVIDER_SITE_OTHER): Payer: Medicare Other | Admitting: Vascular Surgery

## 2016-02-16 VITALS — BP 172/98 | HR 75 | Temp 98.0°F | Resp 18 | Ht 67.0 in | Wt 180.0 lb

## 2016-02-16 DIAGNOSIS — I83892 Varicose veins of left lower extremities with other complications: Secondary | ICD-10-CM

## 2016-02-16 DIAGNOSIS — I83812 Varicose veins of left lower extremities with pain: Secondary | ICD-10-CM

## 2016-02-16 NOTE — Progress Notes (Signed)
Filed Vitals:   02/16/16 1016 02/16/16 1017  BP: 182/102 172/98  Pulse: 75   Temp: 98 F (36.7 C)   Resp: 18   Height: 5\' 7"  (1.702 m)   Weight: 180 lb (81.647 kg)   SpO2: 98%

## 2016-02-16 NOTE — Progress Notes (Signed)
Subjective:     Pati She also has some recurrent varicosities present.  Past Medical History  Diagnosis Date  . Hypertension   . Hyperlipidemia   . Hypothyroidism   . Diabetes mellitus, type 2 ([place])   . Thoracic aneurysm without mention of rupture   . Stroke ([place])   . Skin cancer   . UTI (lower urinary tract infection)   . GERD (gastroesophageal reflux disease)   . DDD (degenerative disc disease)   . DJD (degenerative joint disease)   . Vitamin B12 deficiency   . Allergic rhinitis   . Osteoporosis   . Fall (on)(from) sidewalk curb, sequela 2016    Fx Left foot    Social History  Substance Use Topics  . Smoking status: Never Smoker   . Smokeless tobacco: Never Used  . Alcohol Use: No    Family History  Problem Relation Age of Onset  . Hypertension Mother   . Hypertension Father   . Heart disease Father   . Hypertension Sister   . Hiatal hernia Sister   . Coronary artery disease Sister     Allergies  Allergen Reactions  . Penicillins     REACTION: rash  . Sulfonamide Derivatives     REACTION: rash     Current outpatient prescriptions:  .  apixaban (ELIQUIS) 5 MG TABS tablet, Take 5 mg by mouth 2 (two) times daily., Disp: , Rfl:  .  cholecalciferol (VITAMIN D) 1000 UNITS tablet, Take 1,000 Units by mouth daily., Disp: , Rfl:  .  doxycycline (PERIOSTAT) 20 MG tablet, Take 20 mg by mouth 2 (two) times daily with a meal., Disp: , Rfl: 5 .  furosemide (LASIX) 20 MG tablet, Take 20 mg by mouth., Disp: , Rfl:  .  levothyroxine (SYNTHROID, LEVOTHROID) 100 MCG tablet, Take 100 mcg by mouth daily before breakfast., Disp: , Rfl:  .  losartan (COZAAR) 50 MG tablet, Take 50 mg by mouth daily., Disp: , Rfl:  .  metoprolol succinate (TOPROL-XL) 50 MG 24 hr tablet, Take 50 mg by mouth daily. Take with or immediately following a meal., Disp: , Rfl:  .  omeprazole (PRILOSEC) 20 MG capsule, Take 20 mg by mouth daily., Disp: , Rfl:  .  pravastatin (PRAVACHOL) 10 MG tablet, Take  10 mg by mouth daily., Disp: , Rfl:   Filed Vitals:   02/16/16 1016 02/16/16 1017  BP: 182/102 172/98  Pulse: 75   Temp: 98 F (36.7 C)   Resp: 18   Height: 5\' 7"  (1.702 m)   Weight: 180 lb (81.647 kg)   SpO2: 98%     Body mass index is 28.19 kg/(m^2).        ent ID: Deanna Woodard, female   DOB: [birth year], 75 y.o.   MRN: [id number]  HPI  This 75 year old female returns for further discussion regarding her pain and swelling in the lower extremity on the left. She has a remote history of bilateral laser ablations of great saphenous veins in 2009 by me. The right 1 remained closed but our recent evaluation 3 months ago revealed that she had recanalized her left great saphenous vein. She is on chronic anticoagulation-Eliquis. The swelling has been worsening over the past 12-18 months. She develops aching throbbing and burning discomfort from the knee distally because of the edema. She has tried long-leg elastic compression stockings 20-30 millimeter gradient as well as elevation and ibuprofen with no improvement in her symptoms.  Review of Systems denies chest pain, dyspnea on  exertion, PND, orthopnea. Does have some mild ongoing dementia.     Objective:   Physical Exam BP 172/98 mmHg  Pulse 75  Temp(Src) 98 F (36.7 C)  Resp 18  Ht 5\' 7"  (1.702 m)  Wt 180 lb (81.647 kg)  BMI 28.19 kg/m2  SpO2 98%   Gen. Well-developed well-nourished female no apparent distress alert and oriented 3 Lungs no rhonchi or wheezing Left leg with 1-2+ edema from distal thigh to foot. No hyperpigmentation or active ulceration noted. 3+ dorsalis pedis pulse palpable.  Today I reviewed the formal venous duplex exam performed in November in [place] which revealed large caliber great saphenous vein on the left with gross reflux throughout supplying bulging varicosities. This has recanalized since 09. We had previous documentation of the vein being closed following the procedure.  Also performed a  bedside sono site exam and concur with post findings.     Assessment:      pain and swelling left leg due to gross reflux left great saphenous vein  Patient on chronic anticoagulationEliquis   patient's symptoms are resistant to conservative measures including long-leg elastic compression stockings 20-30 millimeter gradient, elevation, and ibuprofen.     Plan:      patient needs laser ablation left great saphenous vein to hopefully improve her swelling and relieve some of her pain symptoms  we will proceed with precertification to perform this in the near future. She will need to have herEliquis  Discontinued 4 days prior to the procedure and we will resume in about 3 days following the procedure

## 2016-03-15 ENCOUNTER — Encounter: Payer: Self-pay | Admitting: Vascular Surgery

## 2016-03-22 ENCOUNTER — Encounter: Payer: Self-pay | Admitting: Vascular Surgery

## 2016-03-22 ENCOUNTER — Ambulatory Visit (INDEPENDENT_AMBULATORY_CARE_PROVIDER_SITE_OTHER): Payer: Medicare Other | Admitting: Vascular Surgery

## 2016-03-22 VITALS — BP 150/84 | HR 66 | Temp 97.6°F | Resp 16

## 2016-03-22 DIAGNOSIS — I83892 Varicose veins of left lower extremities with other complications: Secondary | ICD-10-CM | POA: Insufficient documentation

## 2016-03-22 NOTE — Progress Notes (Signed)
Laser Ablation Procedure    Date: 03/22/2016   Deanna Woodard DOB:[birth year]  Consent signed: Yes    Surgeon:  Dr. Nelda Severe. Kellie Simmering  Procedure: Laser Ablation: left Greater Saphenous Vein  BP 150/84 mmHg  Pulse 66  Temp(Src) 97.6 F (36.4 C)  Resp 16  SpO2 99%  Tumescent Anesthesia: 250 cc 0.9% NaCl with 50 cc Lidocaine HCL with 1% Epi and 15 cc 8.4% NaHCO3  Local Anesthesia: 6 cc Lidocaine HCL and NaHCO3 (ratio 2:1)  Pulsed Mode: 15 watts, 580ms delay, 1.0 duration  Total Energy: 1346             Total Pulses:   80             Total Time: 1:19    Patient tolerated procedure well  Notes:   Description of Procedure:  After marking the course of the secondary varicosities, the patient was placed on the operating table in the supine position, and the left leg was prepped and draped in sterile fashion.   Local anesthetic was administered and under ultrasound guidance the saphenous vein was accessed with a micro needle and guide wire; then the mirco puncture sheath was placed.  A guide wire was inserted saphenofemoral junction , followed by a 5 french sheath.  The position of the sheath and then the laser fiber below the junction was confirmed using the ultrasound.  Tumescent anesthesia was administered along the course of the saphenous vein using ultrasound guidance. The patient was placed in Trendelenburg position and protective laser glasses were placed on patient and staff, and the laser was fired at 15 watts continuous mode advancing 1-92mm/second for a total of 1346 joules.     Steri strips were applied to the stab wounds and ABD pads and thigh high compression stockings were applied.  Ace wrap bandages were applied over the phlebectomy sites and at the top of the saphenofemoral junction. Blood loss was less than 15 cc.  The patient ambulated out of the operating room having tolerated the procedure well.

## 2016-03-22 NOTE — Progress Notes (Signed)
Subjective:     Patient ID: Deanna Woodard, female   DOB: May 15, 1941, 75 y.o.   MRN: [id number]  HPI this 75 year old female had laser ablation left great saphenous vein from the distal thigh to near the saphenofemoral junction performed under local tumescent anesthesia. A total of 1346 J of energy was utilized. She tolerated the procedure well. She had been on Eliquis prior to the procedure which was discontinued 4 days ago and will be resumed 3 days following the procedure.   Review of Systems     Objective:   Physical Exam BP 150/84 mmHg  Pulse 66  Temp(Src) 97.6 F (36.4 C)  Resp 16  SpO2 99%       Assessment:     Well-tolerated laser ablation left great saphenous vein from distal thigh to near the saphenofemoral junction performed under local tumescent anesthesia    Plan:     Return in 1 week for venous duplex exam to confirm closure left great saphenous vein

## 2016-03-22 NOTE — Progress Notes (Signed)
Filed Vitals:   03/22/16 1445 03/22/16 1446  BP: 152/88 150/84  Pulse: 66   Temp: 97.6 F (36.4 C)   Resp: 16   SpO2: 99%

## 2016-03-23 ENCOUNTER — Telehealth: Payer: Self-pay | Admitting: *Deleted

## 2016-03-23 ENCOUNTER — Encounter: Payer: Self-pay | Admitting: Vascular Surgery

## 2016-03-23 NOTE — Telephone Encounter (Signed)
Pt doing well and following our instructions. Went over when she is to restart Eliquis and also the dose of Ibuprofen that she is to take for a week. We will see her next Monday for her fu appts.

## 2016-03-24 ENCOUNTER — Encounter: Payer: Self-pay | Admitting: Vascular Surgery

## 2016-03-28 ENCOUNTER — Encounter (HOSPITAL_COMMUNITY): Payer: Medicare Other

## 2016-03-28 ENCOUNTER — Ambulatory Visit: Payer: Medicare Other | Admitting: Vascular Surgery

## 2016-03-29 ENCOUNTER — Ambulatory Visit (HOSPITAL_COMMUNITY)
Admission: RE | Admit: 2016-03-29 | Discharge: 2016-03-29 | Disposition: A | Discharge status: Home / Self Care | Payer: Medicare Other | Source: Ambulatory Visit | Attending: Vascular Surgery | Admitting: Vascular Surgery

## 2016-03-29 ENCOUNTER — Ambulatory Visit (INDEPENDENT_AMBULATORY_CARE_PROVIDER_SITE_OTHER): Payer: Medicare Other | Admitting: Vascular Surgery

## 2016-03-29 VITALS — BP 161/97 | HR 69 | Temp 97.6°F | Resp 18

## 2016-03-29 DIAGNOSIS — K219 Gastro-esophageal reflux disease without esophagitis: Secondary | ICD-10-CM | POA: Insufficient documentation

## 2016-03-29 DIAGNOSIS — E039 Hypothyroidism, unspecified: Secondary | ICD-10-CM | POA: Insufficient documentation

## 2016-03-29 DIAGNOSIS — I10 Essential (primary) hypertension: Secondary | ICD-10-CM | POA: Insufficient documentation

## 2016-03-29 DIAGNOSIS — E785 Hyperlipidemia, unspecified: Secondary | ICD-10-CM | POA: Insufficient documentation

## 2016-03-29 DIAGNOSIS — I83892 Varicose veins of left lower extremities with other complications: Secondary | ICD-10-CM | POA: Diagnosis not present

## 2016-03-29 DIAGNOSIS — I83812 Varicose veins of left lower extremities with pain: Secondary | ICD-10-CM | POA: Diagnosis not present

## 2016-03-29 DIAGNOSIS — E119 Type 2 diabetes mellitus without complications: Secondary | ICD-10-CM | POA: Insufficient documentation

## 2016-03-29 DIAGNOSIS — Z9889 Other specified postprocedural states: Secondary | ICD-10-CM | POA: Insufficient documentation

## 2016-03-29 NOTE — Progress Notes (Signed)
Subjective:     Patient ID: Deanna Woodard, female   DOB: [birth year], 75 y.o.   MRN: [id number]  HPI this 75 year old female returns 1 week post-laser ablation left great saphenous vein for painful varicosities and swelling in the left leg. She had previously undergone laser ablation many years ago-2009 which initially was successful. She is on chronic anticoagulation which was stopped 4 days prior to the procedure. She states that her leg below the knee feels and looks much better than prior to the procedure and that her husband has commented on this. She is wearing long leg elastic compression stockings 20-30 millimeter gradient.  Past Medical History  Diagnosis Date  . Hypertension   . Hyperlipidemia   . Hypothyroidism   . Diabetes mellitus, type 2 ([place])   . Thoracic aneurysm without mention of rupture   . Stroke ([place])   . Skin cancer   . UTI (lower urinary tract infection)   . GERD (gastroesophageal reflux disease)   . DDD (degenerative disc disease)   . DJD (degenerative joint disease)   . Vitamin B12 deficiency   . Allergic rhinitis   . Osteoporosis   . Fall (on)(from) sidewalk curb, sequela 2016    Fx Left foot    Social History  Substance Use Topics  . Smoking status: Never Smoker   . Smokeless tobacco: Never Used  . Alcohol Use: No    Family History  Problem Relation Age of Onset  . Hypertension Mother   . Hypertension Father   . Heart disease Father   . Hypertension Sister   . Hiatal hernia Sister   . Coronary artery disease Sister     Allergies  Allergen Reactions  . Penicillins     REACTION: rash  . Sulfonamide Derivatives     REACTION: rash     Current outpatient prescriptions:  .  ALPRAZolam (XANAX) 0.25 MG tablet, Take 0.25 mg by mouth 3 (three) times daily as needed for anxiety., Disp: , Rfl:  .  apixaban (ELIQUIS) 5 MG TABS tablet, Take 5 mg by mouth 2 (two) times daily., Disp: , Rfl:  .  cholecalciferol (VITAMIN D) 1000 UNITS tablet, Take 1,000  Units by mouth daily., Disp: , Rfl:  .  donepezil (ARICEPT) 5 MG tablet, Take 5 mg by mouth at bedtime., Disp: , Rfl:  .  doxycycline (PERIOSTAT) 20 MG tablet, Take 20 mg by mouth 2 (two) times daily with a meal., Disp: , Rfl: 5 .  furosemide (LASIX) 20 MG tablet, Take 20 mg by mouth., Disp: , Rfl:  .  levothyroxine (SYNTHROID, LEVOTHROID) 100 MCG tablet, Take 100 mcg by mouth daily before breakfast., Disp: , Rfl:  .  losartan (COZAAR) 50 MG tablet, Take 25 mg by mouth daily. , Disp: , Rfl:  .  metoprolol succinate (TOPROL-XL) 50 MG 24 hr tablet, Take 100 mg by mouth daily. Take with or immediately following a meal., Disp: , Rfl:  .  omeprazole (PRILOSEC) 20 MG capsule, Take 20 mg by mouth daily., Disp: , Rfl:  .  pravastatin (PRAVACHOL) 10 MG tablet, Take 10 mg by mouth daily., Disp: , Rfl:   Filed Vitals:   03/29/16 1101  BP: 161/97  Pulse: 69  Temp: 97.6 F (36.4 C)  Resp: 18  SpO2: 100%    There is no weight on file to calculate BMI.           Review of Systems patient has memory issues. Denies chest pain, dyspnea on exertion, PND, orthopnea,  hemoptysis.     Objective:   Physical Exam BP 161/97 mmHg  Pulse 69  Temp(Src) 97.6 F (36.4 C)  Resp 18  SpO2 100%  Gen. well-developed well-nourished female no apparent distress alert and oriented 3 Lungs no rhonchi or wheezing Left leg with no distal edema noted. Varicosities in the great saphenous system beginning in the distal thigh extending into the medial calf with some spider and reticular veins particularly in the ankle area. No hyperpigmentation or ulceration noted. 3+ dorsalis pedis pulse palpable.  Today I ordered a venous duplex exam left leg which I reviewed and interpreted. There is partial occlusion of the left proximal and mid thigh great saphenous vein as well as thrombosis of the varicose vein arising from the great saphenous vein and there is no DVT     Assessment:     Mostly successful laser ablation  left great saphenous vein from distal thigh to near the saphenofemoral junction with thrombosis of varicosity in this area    Plan:     Return in 3 months to determine if stab phlebectomy of residual varicosities or sclerotherapy would be indicated Patient resumed Eliquis 2 days following the procedure

## 2016-04-28 ENCOUNTER — Other Ambulatory Visit: Payer: Self-pay | Admitting: Thoracic Surgery (Cardiothoracic Vascular Surgery)

## 2016-04-28 DIAGNOSIS — I712 Thoracic aortic aneurysm, without rupture: Secondary | ICD-10-CM

## 2016-04-28 DIAGNOSIS — I7121 Aneurysm of the ascending aorta, without rupture: Secondary | ICD-10-CM

## 2016-06-07 ENCOUNTER — Ambulatory Visit (INDEPENDENT_AMBULATORY_CARE_PROVIDER_SITE_OTHER): Payer: Medicare Other | Admitting: Thoracic Surgery (Cardiothoracic Vascular Surgery)

## 2016-06-07 ENCOUNTER — Ambulatory Visit
Admission: RE | Admit: 2016-06-07 | Discharge: 2016-06-07 | Disposition: A | Discharge status: Home / Self Care | Payer: Medicare Other | Source: Ambulatory Visit | Attending: Thoracic Surgery (Cardiothoracic Vascular Surgery) | Admitting: Thoracic Surgery (Cardiothoracic Vascular Surgery)

## 2016-06-07 ENCOUNTER — Encounter: Payer: Self-pay | Admitting: Thoracic Surgery (Cardiothoracic Vascular Surgery)

## 2016-06-07 VITALS — BP 161/88 | HR 57 | Ht 66.0 in | Wt 180.0 lb

## 2016-06-07 DIAGNOSIS — I712 Thoracic aortic aneurysm, without rupture: Secondary | ICD-10-CM | POA: Diagnosis not present

## 2016-06-07 DIAGNOSIS — I700 Atherosclerosis of aorta: Secondary | ICD-10-CM | POA: Diagnosis not present

## 2016-06-07 DIAGNOSIS — I7121 Aneurysm of the ascending aorta, without rupture: Secondary | ICD-10-CM

## 2016-06-07 DIAGNOSIS — R911 Solitary pulmonary nodule: Secondary | ICD-10-CM

## 2016-06-07 MED ORDER — IOPAMIDOL (ISOVUE-370) INJECTION 76%
75.0000 mL | Freq: Once | INTRAVENOUS | Status: AC | PRN
  Administered 2016-06-07: 75 mL via INTRAVENOUS

## 2016-06-07 NOTE — Progress Notes (Signed)
[address]       [place] [address]             [phone]       HPI:   Past Medical History:  Diagnosis Date  . Allergic rhinitis   . DDD (degenerative disc disease)   . Diabetes mellitus, type 2 ([place])   . DJD (degenerative joint disease)   . Fall (on)(from) sidewalk curb, sequela 2016   Fx Left foot  . GERD (gastroesophageal reflux disease)   . Hyperlipidemia   . Hypertension   . Hypothyroidism   . Osteoporosis   . Skin cancer   . Stroke ([place])   . Thoracic aneurysm without mention of rupture   . UTI (lower urinary tract infection)   . Vitamin B12 deficiency      Current Outpatient Prescriptions  Medication Sig Dispense Refill  . ALPRAZolam (XANAX) 0.25 MG tablet Take 0.25 mg by mouth 3 (three) times daily as needed for anxiety.    Marland Kitchen apixaban (ELIQUIS) 5 MG TABS tablet Take 5 mg by mouth 2 (two) times daily.    . cholecalciferol (VITAMIN D) 1000 UNITS tablet Take 1,000 Units by mouth daily.    Marland Kitchen donepezil (ARICEPT) 5 MG tablet Take 5 mg by mouth at bedtime.    Marland Kitchen doxycycline (PERIOSTAT) 20 MG tablet Take 20 mg by mouth 2 (two) times daily between meals as needed.   5  . furosemide (LASIX) 20 MG tablet Take 20 mg by mouth.    . levothyroxine (SYNTHROID, LEVOTHROID) 100 MCG tablet Take 100 mcg by mouth daily before breakfast.    . losartan (COZAAR) 50 MG tablet Take 25 mg by mouth daily.     . metoprolol succinate (TOPROL-XL) 50 MG 24 hr tablet Take 100 mg by mouth daily. Take with or immediately following a meal.    . omeprazole (PRILOSEC) 20 MG capsule Take 20 mg by mouth daily.    . pravastatin (PRAVACHOL) 10 MG tablet Take 10 mg by mouth daily.     No current facility-administered medications for this visit.     Physical Exam BP (!) 161/88   Pulse (!) 57   Ht 5\' 6"  (1.676 m)   Wt 180 lb (81.6 kg)   SpO2 98% Comment: ON RA  BMI 29.62 kg/m  75 year old woman in no acute distress Alert and oriented 3, mild facial droop, mild  expressive aphasia Cardiac irregularly irregular, no murmur heard Lungs clear with equal breath sounds bilaterally  Diagnostic Tests: CT ANGIOGRAPHY CHEST WITH CONTRAST  TECHNIQUE: Multidetector CT imaging of the chest was performed using the standard protocol during bolus administration of intravenous contrast. Multiplanar CT image reconstructions and MIPs were obtained to evaluate the vascular anatomy.  CONTRAST:  75 cc Isovue 370  Creatinine was obtained on site at [facility] at [address]  Results: Creatinine 0.9 mg/dL.  COMPARISON:  05/26/2015  FINDINGS: Maximal diameter of the aorta at the sinus of all saw both, sino-tubular junction, ascending aorta, aortic arch, and proximal descending aorta are 3.6 cm, 3.2 cm, 4.0 cm, 3.4 cm, and 3.0 cm. Previously, maximal diameter of the ascending aorta was measured at 4.1 cm. This is not significantly changed. There is no evidence of aortic dissection or intramural hematoma. Great vessels are patent. Bovine arch configuration is noted. Vertebral arteries are also patent. Vertebral arteries are also patent within the confines of the examination.  Little if any atherosclerotic calcified plaque is present. There  is some along the lateral arch. Very slight peripheral aortic valvular calcification.  The left atrium and left ventricle are somewhat dilated.  No obvious acute pulmonary thromboembolism.  No abnormal mediastinal adenopathy. Physiologic pericardial fluid is noted. Left subclavian dual lead pacemaker device is noted.  No pneumothorax.  No pleural effusion  Biapical minimal scarring. Calcified granuloma in the lateral right lower lobe and base of the right middle lobe.  Review of the MIP images confirms the above findings.  IMPRESSION: Stable aneurysmal dilatation of of the ascending aorta at 4.0 cm. Recommend annual imaging followup by CTA or MRA. This recommendation follows 2010  ACCF/AHA/AATS/ACR/ASA/SCA/SCAI/SIR/STS/SVM Guidelines for the Diagnosis and Management of Patients with Thoracic Aortic Disease. Circulation. 2010; 121: [id number]   Electronically Signed   By: Marybelle Killings M.D.   On: 06/07/2016 10:56 I personally reviewed the CT chest and concur with the findings noted above.  Impression: 75 year old woman with a 4 cm ascending aneurysm that is unchanged over the past year. She needs continued annual follow-up.  Hypertension- blood pressure elevated at 161/88 today. She has a blood pressure cuff at home but has not been using it. She says she's been under a good deal of stress recently. I recommended that she check her blood pressure every day. If she notes readings above 140/90 she should contact Dr. Laqueta Due to adjust her medications.  Lung nodules- small nodules noted on CT. Appear to be granulomatous. Unchanged.  Plan:  Return in one year with CT angiogram of chest  Melrose Nakayama, MD [facility] [phone]

## 2016-06-07 NOTE — Patient Instructions (Signed)
Check blood pressure daily. If repeatedly above 140/90 contact Dr. Laqueta Due to adjust medications

## 2016-07-05 ENCOUNTER — Ambulatory Visit: Payer: Medicare Other | Admitting: Vascular Surgery

## 2016-07-22 ENCOUNTER — Encounter: Payer: Self-pay | Admitting: Vascular Surgery

## 2016-07-26 ENCOUNTER — Ambulatory Visit: Payer: Medicare Other | Admitting: Vascular Surgery

## 2016-10-28 IMAGING — CT CT ANGIO CHEST
2 of 5 series · 9 of 30 positions shown · IV contrast (75CC ISOVUE 370)
Comparison: CT scan of May 06, 2014.

CLINICAL DATA: Thoracic aortic aneurysm.

EXAM:
CT ANGIOGRAPHY CHEST WITH CONTRAST
TECHNIQUE: Multidetector CT imaging of the chest was performed using the
standard protocol during bolus administration of intravenous
contrast. Multiplanar CT image reconstructions and MIPs were
obtained to evaluate the vascular anatomy.
CONTRAST:  75 mL of Isovue 370 intravenously.

[Series 4: angio · axial · 0.67mm/px · z∈[-259,-79]mm · 4 of 120 slices shown]
[im 24/120  lung]
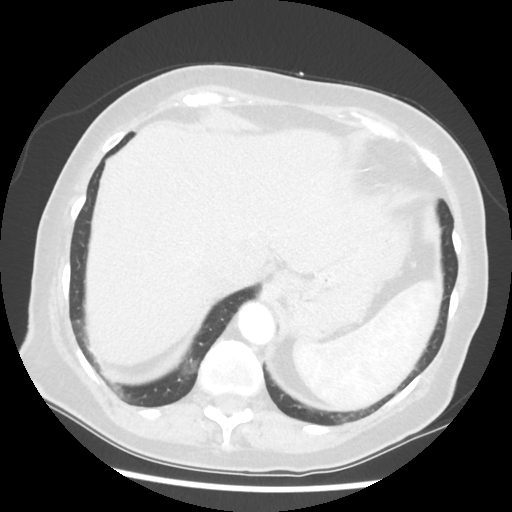
[im 48/120  mediastinal]
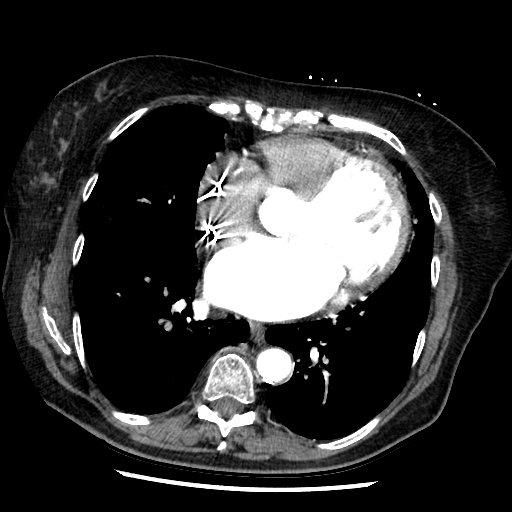
[im 72/120  lung]
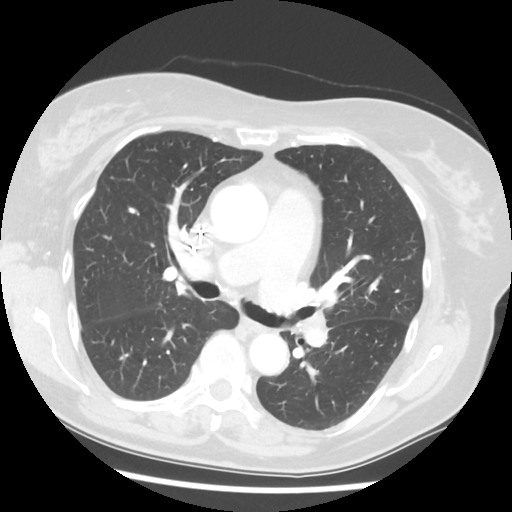
[im 96/120  mediastinal]
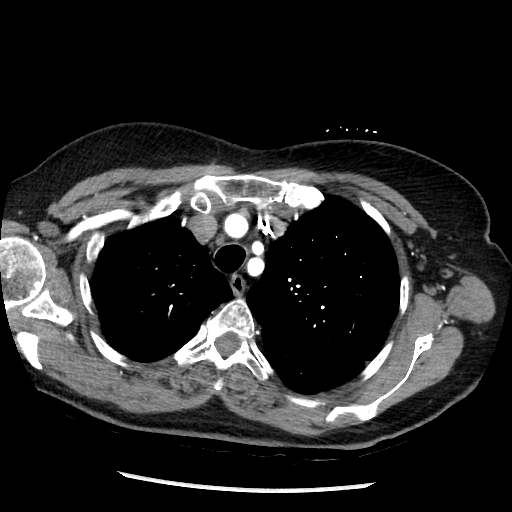

[Series 602: sagittal body · sagittal · 0.67mm/px · 5 of 139 slices shown]
[im 24/139  lung]
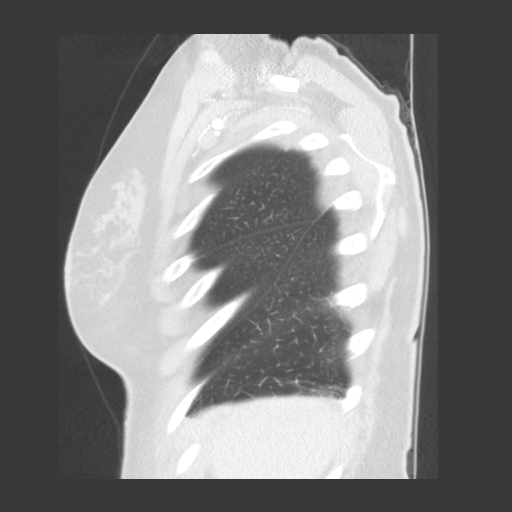
[im 47/139  lung]
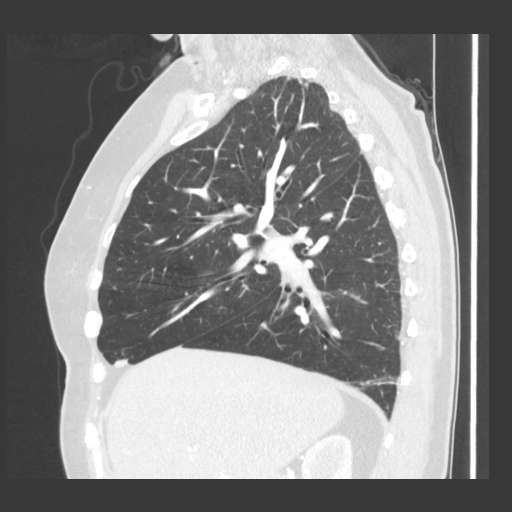
[im 70/139  lung]
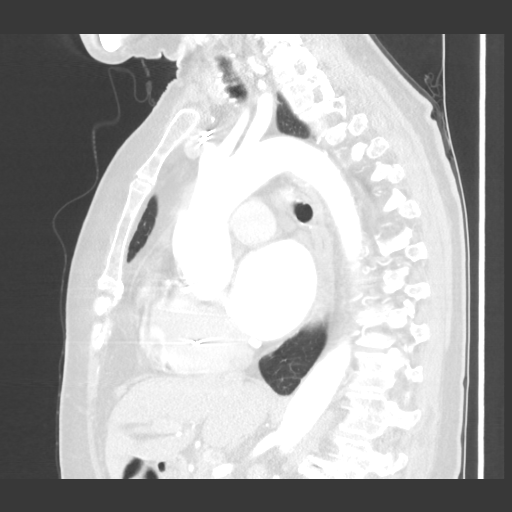
[im 93/139  lung]
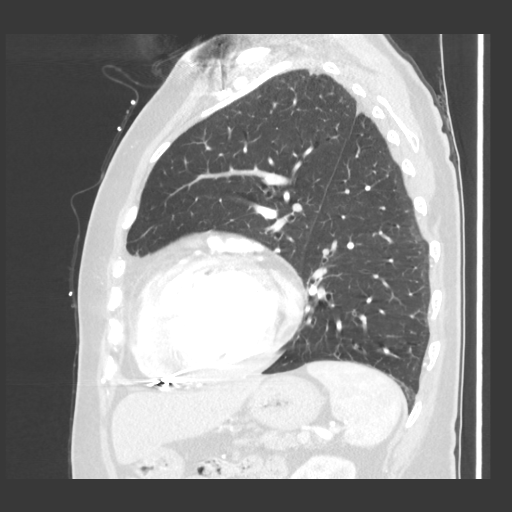
[im 116/139  lung]
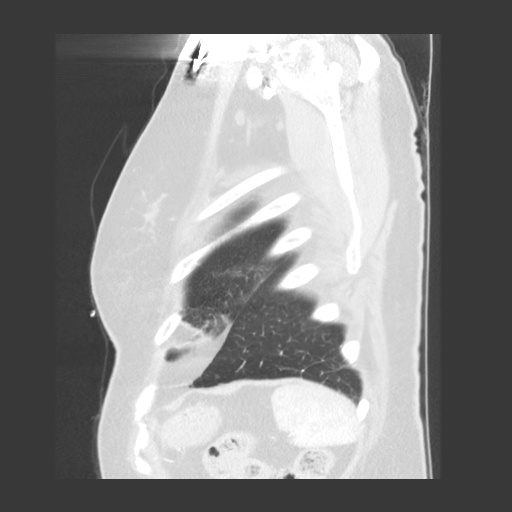

[9 of 30 positions shown; findings below may reference images not displayed]

FINDINGS: Stable old compression deformities are noted in the thoracic spine.
No pneumothorax or pleural effusion is noted. Stable 3 mm nodule is
noted in the lateral portion of the right lower lobe. Mild biapical
scarring is noted. No acute pulmonary disease is noted.

4.1 cm ascending thoracic aortic aneurysm is noted which is not
significantly changed compared to prior exam. Great vessels are
widely patent without significant stenosis. No dissection is noted.
Atherosclerosis of thoracic aorta is noted. Significant mediastinal
adenopathy is noted. Enlarged right thyroid gland is noted.
Visualized portion of upper abdomen appears normal.

Review of the MIP images confirms the above findings.
IMPRESSION: Stable 4.1 cm ascending thoracic aortic aneurysm. Recommend annual
imaging followup by CTA or MRA. This recommendation follows 3747
ACCF/AHA/AATS/ACR/ASA/SCA/[place]A/ELEMENTE/PERFUME/HARBERT Guidelines for the
Diagnosis and Management of Patients with Thoracic Aortic Disease.
Circulation. 3747; 121: [id number].

Stable 3 mm nodule is noted in right lower lobe. This can be
considered benign at this point with no further follow-up required.

## 2016-11-26 DIAGNOSIS — Z950 Presence of cardiac pacemaker: Secondary | ICD-10-CM | POA: Diagnosis not present

## 2016-11-26 DIAGNOSIS — I209 Angina pectoris, unspecified: Secondary | ICD-10-CM | POA: Diagnosis not present

## 2016-11-26 DIAGNOSIS — I10 Essential (primary) hypertension: Secondary | ICD-10-CM | POA: Diagnosis not present

## 2016-11-26 DIAGNOSIS — R079 Chest pain, unspecified: Secondary | ICD-10-CM | POA: Diagnosis not present

## 2016-11-26 DIAGNOSIS — E876 Hypokalemia: Secondary | ICD-10-CM | POA: Diagnosis not present

## 2016-11-27 DIAGNOSIS — Z950 Presence of cardiac pacemaker: Secondary | ICD-10-CM | POA: Diagnosis not present

## 2016-11-27 DIAGNOSIS — E876 Hypokalemia: Secondary | ICD-10-CM | POA: Diagnosis not present

## 2016-11-27 DIAGNOSIS — R079 Chest pain, unspecified: Secondary | ICD-10-CM | POA: Diagnosis not present

## 2016-11-27 DIAGNOSIS — I209 Angina pectoris, unspecified: Secondary | ICD-10-CM | POA: Diagnosis not present

## 2017-04-28 ENCOUNTER — Other Ambulatory Visit: Payer: Self-pay | Admitting: *Deleted

## 2017-04-28 DIAGNOSIS — I712 Thoracic aortic aneurysm, without rupture, unspecified: Secondary | ICD-10-CM

## 2017-06-13 ENCOUNTER — Ambulatory Visit (INDEPENDENT_AMBULATORY_CARE_PROVIDER_SITE_OTHER): Payer: Medicare Other | Admitting: Thoracic Surgery (Cardiothoracic Vascular Surgery)

## 2017-06-13 ENCOUNTER — Encounter: Payer: Self-pay | Admitting: Thoracic Surgery (Cardiothoracic Vascular Surgery)

## 2017-06-13 ENCOUNTER — Ambulatory Visit
Admission: RE | Admit: 2017-06-13 | Discharge: 2017-06-13 | Disposition: A | Discharge status: Home / Self Care | Payer: Medicare Other | Source: Ambulatory Visit | Attending: Thoracic Surgery (Cardiothoracic Vascular Surgery) | Admitting: Thoracic Surgery (Cardiothoracic Vascular Surgery)

## 2017-06-13 VITALS — BP 154/91 | HR 69 | Resp 16 | Ht 66.0 in | Wt 164.0 lb

## 2017-06-13 DIAGNOSIS — R911 Solitary pulmonary nodule: Secondary | ICD-10-CM | POA: Diagnosis not present

## 2017-06-13 DIAGNOSIS — I7121 Aneurysm of the ascending aorta, without rupture: Secondary | ICD-10-CM

## 2017-06-13 DIAGNOSIS — I712 Thoracic aortic aneurysm, without rupture, unspecified: Secondary | ICD-10-CM

## 2017-06-13 MED ORDER — IOPAMIDOL (ISOVUE-370) INJECTION 76%
75.0000 mL | Freq: Once | INTRAVENOUS | Status: AC | PRN
Start: 2017-06-13 — End: 2017-06-13
  Administered 2017-06-13: 75 mL via INTRAVENOUS

## 2017-06-13 NOTE — Progress Notes (Signed)
[address]       [place] [address]             [phone]     HPI: Ms. Orrin Brigham returns for an annual follow-up of her ascending aneurysm  Mrs. Yeats is a 76 year old woman with a history of hypertension, hyperlipidemia, type 2 diabetes, atrial fibrillation, and a stroke complicated by expressive aphasia. She was found to have a 4 cm ascending aneurysm back in 2015. She's been followed with annual CT scan since then.  She continues to have problems with expressive aphasia. She has been having memory problems. She has seen a couple of specialist and there is complete an opinion as to whether she may have Alzheimer's. She denies chest pain, pressure, or tightness. She gets short of breath with exertion. There has not been any recent change in that. She does check her blood pressure at home on a regular basis and says the systolic 18E always in the 120s or 130s.  Past Medical History:  Diagnosis Date  . Allergic rhinitis   . DDD (degenerative disc disease)   . Diabetes mellitus, type 2 ([place])   . DJD (degenerative joint disease)   . Fall (on)(from) sidewalk curb, sequela 2016   Fx Left foot  . GERD (gastroesophageal reflux disease)   . Hyperlipidemia   . Hypertension   . Hypothyroidism   . Osteoporosis   . Skin cancer   . Stroke ([place])   . Thoracic aneurysm without mention of rupture   . UTI (lower urinary tract infection)   . Vitamin B12 deficiency     Current Outpatient Prescriptions  Medication Sig Dispense Refill  . ALPRAZolam (XANAX) 0.25 MG tablet Take 0.25 mg by mouth 3 (three) times daily as needed for anxiety.    Marland Kitchen apixaban (ELIQUIS) 5 MG TABS tablet Take 5 mg by mouth 2 (two) times daily.    . cholecalciferol (VITAMIN D) 1000 UNITS tablet Take 1,000 Units by mouth daily.    Marland Kitchen donepezil (ARICEPT) 5 MG tablet Take 5 mg by mouth at bedtime.    . furosemide (LASIX) 20 MG tablet Take 20 mg by mouth.    . levothyroxine (SYNTHROID, LEVOTHROID) 100 MCG  tablet Take 100 mcg by mouth daily before breakfast.    . losartan (COZAAR) 50 MG tablet Take 25 mg by mouth daily.     . metoprolol succinate (TOPROL-XL) 50 MG 24 hr tablet Take 100 mg by mouth daily. Take with or immediately following a meal.    . omeprazole (PRILOSEC) 20 MG capsule Take 20 mg by mouth daily.    . pravastatin (PRAVACHOL) 10 MG tablet Take 10 mg by mouth daily.     No current facility-administered medications for this visit.     Physical Exam BP (!) 154/91 (BP Location: Right Arm, Patient Position: Sitting, Cuff Size: Large)   Pulse 69   Resp 16   Ht 5\' 6"  (1.676 m)   Wt 164 lb (74.4 kg)   SpO2 98% Comment: RA  BMI 26.29 kg/m  76 year old woman in no acute distress Alert and oriented, mild expressive aphasia No carotid bruits Lungs clear with breath sounds bilaterally Cardiac regular rate and rhythm normal S1 and S2, no rubs, murmurs, or gallops.  Diagnostic Tests: CT ANGIOGRAPHY CHEST WITH CONTRAST  TECHNIQUE: Multidetector CT imaging of the chest was performed using the standard protocol during bolus administration of intravenous contrast. Multiplanar CT image reconstructions and MIPs were obtained to evaluate the  vascular anatomy.  CONTRAST:  75 mL Isovue 370.  COMPARISON:  06/07/2016  FINDINGS: Cardiovascular: Atherosclerotic calcifications of the thoracic aorta are again identified. Dilatation of the ascending aorta is seen to 4 cm stable in appearance from the prior exam. The sinus of Valsalva measures approximately 3.7 cm. The sino-tubular junction measures 3 cm. Normal tapering is noted in the descending aorta. No findings of dissection are seen. Mild cardiac enlargement is noted. Pacing device is again seen. No pericardial fluid is noted. No significant coronary calcifications are seen.  Mediastinum/Nodes: The thoracic inlet is within normal limits. No significant hilar or mediastinal adenopathy is noted. The esophagus is within normal  limits.  Lungs/Pleura: Lungs are well aerated bilaterally. No focal infiltrate or sizable effusion is seen. Few scattered calcified and noncalcified nodules are seen consistent with prior granulomatous disease. These are stable in appearance from the prior exam.  Upper Abdomen: Changes consistent with prior cholecystectomy. The remainder of the upper abdomen appears within normal limits.  Musculoskeletal: Degenerative changes of the thoracic spine are noted. No acute bony abnormality is seen. Progressive compression at T6 is noted when compared with the prior exam.  Review of the MIP images confirms the above findings.  IMPRESSION: 4 cm ascending thoracic aneurysm stable from the prior exam. Recommend annual imaging followup by CTA or MRA. This recommendation follows 2010 ACCF/AHA/AATS/ACR/ASA/SCA/SCAI/SIR/STS/SVM Guidelines for the Diagnosis and Management of Patients with Thoracic Aortic Disease. Circulation. 2010; 121: [id number]  Aortic aneurysm NOS (ICD10-I71.9).   Electronically Signed   By: Inez Catalina M.D.   On: 06/13/2017 10:26 I personally reviewed the CT chest images and concur with findings noted above  Impression: Mrs. Selvy is a 76 year old woman who has a 4.0 cm ascending aneurysm. The aneurysm was first noted in 2015 and is been unchanged since then. She does need continued annual follow-up.  Hypertension- blood pressure was elevated today. She husband does say that she checks on a regular basis and it is almost always between 559 and 741 systolic. I recommended they continue to check a regular basis and if systolic remains elevated above 140 they should contact Dr. Laqueta Due to adjust medications.  Plan: Return in one year with CT angiogram chest  Melrose Nakayama, MD [facility] [phone]

## 2018-05-04 ENCOUNTER — Other Ambulatory Visit: Payer: Self-pay | Admitting: *Deleted

## 2018-05-04 DIAGNOSIS — I712 Thoracic aortic aneurysm, without rupture, unspecified: Secondary | ICD-10-CM

## 2018-06-19 ENCOUNTER — Other Ambulatory Visit: Payer: Medicare Other

## 2018-06-19 ENCOUNTER — Encounter: Payer: Medicare Other | Admitting: Thoracic Surgery (Cardiothoracic Vascular Surgery)

## 2018-10-18 ENCOUNTER — Encounter: Payer: Self-pay | Admitting: Radiology

## 2018-10-19 ENCOUNTER — Ambulatory Visit
Admission: RE | Admit: 2018-10-19 | Discharge: 2018-10-19 | Disposition: A | Discharge status: Home / Self Care | Payer: Medicare Other | Source: Ambulatory Visit | Attending: Thoracic Surgery (Cardiothoracic Vascular Surgery) | Admitting: Thoracic Surgery (Cardiothoracic Vascular Surgery)

## 2018-10-19 ENCOUNTER — Inpatient Hospital Stay: Admission: RE | Admit: 2018-10-19 | Payer: Medicare Other | Source: Ambulatory Visit

## 2018-10-19 DIAGNOSIS — I712 Thoracic aortic aneurysm, without rupture, unspecified: Secondary | ICD-10-CM

## 2018-10-19 MED ORDER — IOPAMIDOL (ISOVUE-370) INJECTION 76%
75.0000 mL | Freq: Once | INTRAVENOUS | Status: AC | PRN
  Administered 2018-10-19: 75 mL via INTRAVENOUS

## 2018-10-23 ENCOUNTER — Ambulatory Visit: Payer: Medicare Other | Admitting: Thoracic Surgery (Cardiothoracic Vascular Surgery)

## 2018-10-23 ENCOUNTER — Other Ambulatory Visit: Payer: Self-pay

## 2018-10-23 ENCOUNTER — Encounter: Payer: Self-pay | Admitting: Thoracic Surgery (Cardiothoracic Vascular Surgery)

## 2018-10-23 VITALS — BP 149/91 | HR 69 | Resp 16 | Ht 69.0 in | Wt 165.0 lb

## 2018-10-23 DIAGNOSIS — I712 Thoracic aortic aneurysm, without rupture, unspecified: Secondary | ICD-10-CM

## 2018-10-23 NOTE — Progress Notes (Signed)
[address]       [place] [address]             [phone]       HPI: Deanna Woodard returns for follow-up regarding her ascending aneurysm  Rakesha Woodard is a 78 year old woman who was first found to have an ascending aneurysm in 2015.  She has been followed since then.  Past medical history significant for hypertension, hyperlipidemia, type 2 diabetes, atrial fibrillation, and stroke complicated by expressive aphasia.  She says that she has been diagnosed more recently with Parkinson's and Alzheimer's.  She denies any chest pain or tightness.  She has been having a lot of difficulty with tremor.  Her memory problems have been getting worse according to her husband.  Past Medical History:  Diagnosis Date  . Allergic rhinitis   . DDD (degenerative disc disease)   . 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 Diabetes mellitus, type 2 ([place])   . DJD (degenerative joint disease)   . Fall (on)(from) sidewalk curb, sequela 2016   Fx Left foot  . GERD (gastroesophageal reflux disease)   . Hyperlipidemia   . Hypertension   . Hypothyroidism   . Osteoporosis   . Parkinson's disease ([place])   . Skin cancer   . Stroke ([place])   . Thoracic aneurysm without mention of rupture   . UTI (lower urinary tract infection)   . Vitamin B12 deficiency     Current Outpatient Medications  Medication Sig Dispense Refill  . ALPRAZolam (XANAX) 0.25 MG tablet Take 0.25 mg by mouth 3 (three) times daily as needed for anxiety.    Marland Kitchen amiodarone (PACERONE) 200 MG tablet Take 200 mg by mouth daily.    Marland Kitchen apixaban (ELIQUIS) 5 MG TABS tablet Take 5 mg by mouth 2 (two) times daily.    . cholecalciferol (VITAMIN D) 1000 UNITS tablet Take 1,000 Units by mouth daily.    Marland Kitchen donepezil (ARICEPT) 5 MG tablet Take 5 mg by mouth at bedtime.    . furosemide (LASIX) 20 MG tablet Take 20 mg by mouth.    . levothyroxine (SYNTHROID, LEVOTHROID) 100 MCG tablet Take 100 mcg by mouth daily before  breakfast.    . losartan (COZAAR) 50 MG tablet Take 25 mg by mouth daily.     . memantine (NAMENDA) 10 MG tablet Take 10 mg by mouth 2 (two) times daily.    . metoprolol succinate (TOPROL-XL) 50 MG 24 hr tablet Take 100 mg by mouth daily. Take with or immediately following a meal.    . omeprazole (PRILOSEC) 20 MG capsule Take 20 mg by mouth daily.    . pravastatin (PRAVACHOL) 10 MG tablet Take 10 mg by mouth daily.     No current facility-administered medications for this visit.     Physical Exam BP (!) 149/91 (BP Location: Left Arm, Patient Position: Sitting, Cuff Size: Large)   Pulse 69   Resp 16   Ht 5\' 9"  (1.753 m)   Wt 165 lb (74.8 kg)   SpO2 92% Comment: ON RA  BMI 24.64 kg/m  78 year old woman in no acute distress Alert and oriented x3.  Expressive aphasia.  Prominent tremor. No carotid bruits Cardiac regular rate and rhythm normal S1, S2 Lungs clear with equal breath sounds bilaterally  Diagnostic Tests: CT ANGIOGRAPHY CHEST WITH CONTRAST  TECHNIQUE: Multidetector CT imaging of the chest was performed using the standard protocol during bolus administration of intravenous contrast. Multiplanar CT  image reconstructions and MIPs were obtained to evaluate the vascular anatomy.  CONTRAST:  71mL ISOVUE-370 IOPAMIDOL (ISOVUE-370) INJECTION 76%  Creatinine was obtained on site at [facility] at [address]  Results: Creatinine 1.0 mg/dL. GFR 54  COMPARISON:  06/13/2017 and previous  FINDINGS: Cardiovascular: Left subclavian triple lead transvenous pacemaker. Some contrast reflux into the azygos arch. Moderate cardiomegaly. No pericardial effusion. Dilated central pulmonary arteries suggesting pulmonary arterial hypertension. Satisfactory opacification of pulmonary arteries noted, and there is no evidence of pulmonary emboli. Good contrast opacification of the thoracic aorta. Transverse dimensions as follows:  3.6 cm sinuses of  Valsalva  2.9 cm sino-tubular junction  4 cm mid ascending (previously 4 cm)  3.7 cm distal ascending/proximal arch  2.7 cm distal arch/proximal descending  2.1 cm distal descending  Bovine variant brachiocephalic arterial origin anatomy without proximal stenosis. No dissection or aortic stenosis. Visualized proximal abdominal aorta is atheromatous, otherwise unremarkable.  Mediastinum/Nodes: No hilar or mediastinal adenopathy.  Lungs/Pleura: No pleural effusion. No pneumothorax. Small calcified granuloma, superior segment right lower lobe. Lungs otherwise clear.  Upper Abdomen: Cholecystectomy clips. No acute findings.  Musculoskeletal: Spondylitic changes in the lower cervical spine. Stable compression fracture deformities of T6, T7, T10. Spondylitic changes in the visualized upper lumbar spine. Sclerotic foci in the sternum stable since scans dating back to 11/05/2008, presumably benign. No fracture or new worrisome bone lesion.  Review of the MIP images confirms the above findings.  IMPRESSION: 1. Stable 4 cm ascending aortic aneurysm without complicating features. Recommend annual imaging followup by CTA or MRA. This recommendation follows 2010 ACCF/AHA/AATS/ACR/ASA/SCA/SCAI/SIR/STS/SVM Guidelines for the Diagnosis and Management of Patients with Thoracic Aortic Disease. 2010; 121: [id number]. 2. Stable thoracic compression fracture deformities.  Aortic aneurysm NOS (ICD10-I71.9).   Electronically Signed   By: Lucrezia Europe M.D.   On: 10/19/2018 12:44 I personally reviewed the CT images and concur with the findings noted above  Impression: [name] is a 78 year old woman with a known ascending aneurysm that we have been following for about 5 years now.  The aneurysm has been stable over that time.  Hypertension-blood pressure continues to be elevated every time I see her.  She says that this is better when she checks it at home.  She did not  bring her records from her to indicate exactly what it has been.  Parkinson's/dementia-is the primary issue currently.  She is having significant difficulty in that area.  Plan: Return in 1 year with CT Angio of chest  Melrose Nakayama, MD [facility] [phone]

## 2019-01-02 ENCOUNTER — Encounter (HOSPITAL_COMMUNITY): Payer: Self-pay | Admitting: Interventional Radiology

## 2019-10-01 ENCOUNTER — Other Ambulatory Visit: Payer: Self-pay | Admitting: Thoracic Surgery (Cardiothoracic Vascular Surgery)

## 2019-10-01 DIAGNOSIS — I712 Thoracic aortic aneurysm, without rupture, unspecified: Secondary | ICD-10-CM

## 2019-10-29 ENCOUNTER — Other Ambulatory Visit: Payer: Self-pay | Admitting: Thoracic Surgery (Cardiothoracic Vascular Surgery)

## 2019-10-29 DIAGNOSIS — I712 Thoracic aortic aneurysm, without rupture, unspecified: Secondary | ICD-10-CM

## 2019-10-29 LAB — BUN: BUN: 33 mg/dL — ABNORMAL HIGH (ref 7–25)

## 2019-10-29 LAB — CREATININE, SERUM: Creat: 1.16 mg/dL — ABNORMAL HIGH (ref 0.60–0.93)

## 2019-10-31 ENCOUNTER — Ambulatory Visit
Admission: RE | Admit: 2019-10-31 | Discharge: 2019-10-31 | Disposition: A | Discharge status: Home / Self Care | Payer: Medicare PPO | Source: Ambulatory Visit | Attending: Thoracic Surgery (Cardiothoracic Vascular Surgery) | Admitting: Thoracic Surgery (Cardiothoracic Vascular Surgery)

## 2019-10-31 DIAGNOSIS — I712 Thoracic aortic aneurysm, without rupture, unspecified: Secondary | ICD-10-CM

## 2019-10-31 MED ORDER — IOPAMIDOL (ISOVUE-370) INJECTION 76%
75.0000 mL | Freq: Once | INTRAVENOUS | Status: AC | PRN
  Administered 2019-10-31: 75 mL via INTRAVENOUS

## 2019-11-05 ENCOUNTER — Ambulatory Visit: Payer: Medicare PPO | Admitting: Thoracic Surgery (Cardiothoracic Vascular Surgery)

## 2019-11-05 ENCOUNTER — Other Ambulatory Visit: Payer: Self-pay

## 2019-11-05 ENCOUNTER — Encounter: Payer: Self-pay | Admitting: Thoracic Surgery (Cardiothoracic Vascular Surgery)

## 2019-11-05 VITALS — BP 153/97 | HR 70 | Temp 97.9°F | Resp 16 | Ht 67.0 in | Wt 155.0 lb

## 2019-11-05 DIAGNOSIS — I712 Thoracic aortic aneurysm, without rupture, unspecified: Secondary | ICD-10-CM

## 2019-11-05 NOTE — Progress Notes (Signed)
[address]       [place] [address]             [phone]       HPI: Deanna Woodard returns for a scheduled 1 year follow-up visit  Deanna Woodard is a 79 year old woman with a past history of hypertension, hyperlipidemia, osteoporosis, falls, type 2 diabetes, Parkinson's, and "Alzheimer's" (possible Parkinson's associated dementia).  She was found to have an ascending aortic aneurysm back in 2015.  She has been followed since then.  I last saw her in January 2020 and the aneurysm was unchanged at that time.  She continues to have issues associated with her Parkinson's and dementia.  She is still at home with her husband.  Her activities are limited.  He says she has been slowing down.  She recently has been started on Lasix twice daily by Dr. Laqueta Due.  Past Medical History:  Diagnosis Date  . Allergic rhinitis   . DDD (degenerative disc disease)   . Dementia associated with Parkinson's disease ([place])   . Diabetes mellitus, type 2 ([place])   . DJD (degenerative joint disease)   . Fall (on)(from) sidewalk curb, sequela 2016   Fx Left foot  . GERD (gastroesophageal reflux disease)   . Hyperlipidemia   . Hypertension   . Hypothyroidism   . Osteoporosis   . Parkinson's disease ([place])   . Skin cancer   . Stroke ([place])   . Thoracic aneurysm without mention of rupture   . UTI (lower urinary tract infection)   . Vitamin B12 deficiency     Current Outpatient Medications  Medication Sig Dispense Refill  . ALPRAZolam (XANAX) 0.25 MG tablet Take 0.25 mg by mouth 3 (three) times daily as needed for anxiety.    Marland Kitchen amiodarone (PACERONE) 200 MG tablet Take 200 mg by mouth daily.    Marland Kitchen apixaban (ELIQUIS) 5 MG TABS tablet Take 5 mg by mouth 2 (two) times daily.    . cholecalciferol (VITAMIN D) 1000 UNITS tablet Take 1,000 Units by mouth daily.    Marland Kitchen donepezil (ARICEPT) 5 MG tablet Take 5 mg by mouth at bedtime.    . furosemide (LASIX) 20 MG tablet Take 20 mg by mouth.    .  levothyroxine (SYNTHROID, LEVOTHROID) 100 MCG tablet Take 100 mcg by mouth daily before breakfast.    . losartan (COZAAR) 50 MG tablet Take 25 mg by mouth daily.     . memantine (NAMENDA) 10 MG tablet Take 10 mg by mouth 2 (two) times daily.    . metoprolol succinate (TOPROL-XL) 50 MG 24 hr tablet Take 100 mg by mouth daily. Take with or immediately following a meal.    . omeprazole (PRILOSEC) 20 MG capsule Take 20 mg by mouth daily.    . pravastatin (PRAVACHOL) 10 MG tablet Take 10 mg by mouth daily.     No current facility-administered medications for this visit.    Physical Exam BP (!) 153/97 (BP Location: Left Arm, Patient Position: Sitting, Cuff Size: Normal)   Pulse 70   Temp 97.9 F (36.6 C)   Resp 16   Ht 5\' 7"  (1.702 m)   Wt 155 lb (70.3 kg)   SpO2 94% Comment: RA  BMI 24.70 kg/m  79 year old woman in no acute distress Alert and oriented x3, tremor, expressive aphasia Cardiac regular rate and rhythm with a 2/6 systolic murmur Lungs clear bilaterally 3+ edema bilateral lower extremities  Diagnostic Tests: CT ANGIOGRAPHY CHEST WITH  CONTRAST  TECHNIQUE: Multidetector CT imaging of the chest was performed using the standard protocol during bolus administration of intravenous contrast. Multiplanar CT image reconstructions and MIPs were obtained to evaluate the vascular anatomy.  CONTRAST:  25mL ISOVUE-370 IOPAMIDOL (ISOVUE-370) INJECTION 76%  COMPARISON:  10/19/2018  FINDINGS: Cardiovascular: Bovine arch. Aortic and branch vessel atherosclerosis. Pacer.  No central pulmonary embolism, on this non-dedicated study.  Pulmonary artery enlargement, outflow tract 3.2 cm. Moderate cardiomegaly with left atrial enlargement. No pericardial effusion.  Ascending aortic dilatation. Using same measurement technique as on the prior exam, measurements include:  3.1 cm at the sinuses of Valsalva.  2.8 cm at the sinotubular junction.  4.0 cm in the mid ascending  aorta.  Unchanged since the prior.  3.4 cm at the distal ascending/proximal arch.  2.7 cm at the distal arch/proximal descending.  2.2 cm in the distal descending aorta.  No aortic dissection.  Tortuous thoracic aorta.  Mediastinum/Nodes: Right-sided thyroid enlargement with minimal extension into the upper chest, similar. No mediastinal or definite hilar adenopathy, given limitations of unenhanced CT.  Lungs/Pleura: No pleural fluid.  Calcified granulomas within the right lung. Scarring or subsegmental atelectasis at the lung bases.  Upper Abdomen: Normal imaged portions of the liver, spleen, stomach, pancreas, adrenal glands, kidneys.  Musculoskeletal: Osteopenia. a new mild T5 compression deformity. Moderate T6 and moderate to severe T7 compression deformities are similar. A mild T10 compression deformity is also not significantly changed. Upper lumbar spondylosis. Probable bone islands within the sternal manubrium.  Review of the MIP images confirms the above findings.  IMPRESSION: 1. Similar dilatation of the ascending aorta, borderline aneurysmal. 2.  No acute process in the chest. 3. Pulmonary artery enlargement suggests pulmonary arterial hypertension. 4. Osteopenia with a new T5 compression deformity. Other lower thoracic compression deformities are unchanged. 5.  Aortic Atherosclerosis (ICD10-I70.0).   Electronically Signed   By: Abigail Miyamoto M.D.   On: 10/31/2019 10:41 I personally reviewed the CT images and concur with the findings noted above  Impression: Deanna Woodard is a 79 year old woman with a past history of hypertension, hyperlipidemia, Parkinson's, dementia, osteoporosis, type 2 diabetes, and an ascending aneurysm.  That was first found in 2015.  Over the past several scans it has remained stable at 4 cm.  Hypertension-blood pressure was elevated today at 153/97.  According to her husband she has been taking her medications.  They  have not been monitoring her blood pressure at home even though they do have a cuff.  I recommended they do that and if she is consistently above [id number] systolic she probably needs additional medications added.  I will defer choice of medications Dr. Laqueta Due, she would like me to handle that.   Plan: Check blood pressure on regular basis with goal of maintaining systolic less than [id number] Return in 1 year with CT angio of chest  Melrose Nakayama, MD [facility] [phone]

## 2019-11-25 DIAGNOSIS — Z950 Presence of cardiac pacemaker: Secondary | ICD-10-CM | POA: Diagnosis not present

## 2019-11-25 DIAGNOSIS — I420 Dilated cardiomyopathy: Secondary | ICD-10-CM | POA: Diagnosis not present

## 2019-11-25 DIAGNOSIS — I480 Paroxysmal atrial fibrillation: Secondary | ICD-10-CM | POA: Diagnosis not present

## 2019-11-25 DIAGNOSIS — I255 Ischemic cardiomyopathy: Secondary | ICD-10-CM | POA: Diagnosis not present

## 2019-12-17 DIAGNOSIS — E042 Nontoxic multinodular goiter: Secondary | ICD-10-CM | POA: Diagnosis not present

## 2019-12-17 DIAGNOSIS — E041 Nontoxic single thyroid nodule: Secondary | ICD-10-CM | POA: Diagnosis not present

## 2019-12-18 DIAGNOSIS — Z950 Presence of cardiac pacemaker: Secondary | ICD-10-CM | POA: Diagnosis not present

## 2019-12-19 DIAGNOSIS — I420 Dilated cardiomyopathy: Secondary | ICD-10-CM | POA: Diagnosis not present

## 2019-12-19 DIAGNOSIS — Z7901 Long term (current) use of anticoagulants: Secondary | ICD-10-CM | POA: Diagnosis not present

## 2019-12-19 DIAGNOSIS — I255 Ischemic cardiomyopathy: Secondary | ICD-10-CM | POA: Diagnosis not present

## 2019-12-19 DIAGNOSIS — E782 Mixed hyperlipidemia: Secondary | ICD-10-CM | POA: Diagnosis not present

## 2019-12-19 DIAGNOSIS — Z950 Presence of cardiac pacemaker: Secondary | ICD-10-CM | POA: Diagnosis not present

## 2019-12-19 DIAGNOSIS — F0280 Dementia in other diseases classified elsewhere without behavioral disturbance: Secondary | ICD-10-CM | POA: Diagnosis not present

## 2019-12-19 DIAGNOSIS — I480 Paroxysmal atrial fibrillation: Secondary | ICD-10-CM | POA: Diagnosis not present

## 2019-12-19 DIAGNOSIS — I272 Pulmonary hypertension, unspecified: Secondary | ICD-10-CM | POA: Diagnosis not present

## 2019-12-19 DIAGNOSIS — G301 Alzheimer's disease with late onset: Secondary | ICD-10-CM | POA: Diagnosis not present

## 2019-12-23 DIAGNOSIS — E042 Nontoxic multinodular goiter: Secondary | ICD-10-CM | POA: Diagnosis not present

## 2019-12-31 DIAGNOSIS — L821 Other seborrheic keratosis: Secondary | ICD-10-CM | POA: Diagnosis not present

## 2019-12-31 DIAGNOSIS — L814 Other melanin hyperpigmentation: Secondary | ICD-10-CM | POA: Diagnosis not present

## 2020-03-20 DIAGNOSIS — E039 Hypothyroidism, unspecified: Secondary | ICD-10-CM | POA: Diagnosis not present

## 2020-03-20 DIAGNOSIS — I10 Essential (primary) hypertension: Secondary | ICD-10-CM | POA: Diagnosis not present

## 2020-03-20 DIAGNOSIS — R079 Chest pain, unspecified: Secondary | ICD-10-CM | POA: Diagnosis not present

## 2020-03-20 DIAGNOSIS — Z6822 Body mass index (BMI) 22.0-22.9, adult: Secondary | ICD-10-CM | POA: Diagnosis not present

## 2020-03-20 DIAGNOSIS — E1169 Type 2 diabetes mellitus with other specified complication: Secondary | ICD-10-CM | POA: Diagnosis not present

## 2020-03-20 DIAGNOSIS — Z79899 Other long term (current) drug therapy: Secondary | ICD-10-CM | POA: Diagnosis not present

## 2020-03-20 DIAGNOSIS — I712 Thoracic aortic aneurysm, without rupture: Secondary | ICD-10-CM | POA: Diagnosis not present

## 2020-03-20 DIAGNOSIS — E785 Hyperlipidemia, unspecified: Secondary | ICD-10-CM | POA: Diagnosis not present

## 2020-04-21 DIAGNOSIS — G309 Alzheimer's disease, unspecified: Secondary | ICD-10-CM | POA: Diagnosis not present

## 2020-04-21 DIAGNOSIS — M40209 Unspecified kyphosis, site unspecified: Secondary | ICD-10-CM | POA: Diagnosis not present

## 2020-04-21 DIAGNOSIS — R2689 Other abnormalities of gait and mobility: Secondary | ICD-10-CM | POA: Diagnosis not present

## 2020-04-21 DIAGNOSIS — Z950 Presence of cardiac pacemaker: Secondary | ICD-10-CM | POA: Diagnosis not present

## 2020-04-21 DIAGNOSIS — G4700 Insomnia, unspecified: Secondary | ICD-10-CM | POA: Diagnosis not present

## 2020-04-21 DIAGNOSIS — G3189 Other specified degenerative diseases of nervous system: Secondary | ICD-10-CM | POA: Diagnosis not present

## 2020-04-21 DIAGNOSIS — R251 Tremor, unspecified: Secondary | ICD-10-CM | POA: Diagnosis not present

## 2020-04-21 DIAGNOSIS — F0280 Dementia in other diseases classified elsewhere without behavioral disturbance: Secondary | ICD-10-CM | POA: Diagnosis not present

## 2020-04-27 DIAGNOSIS — Z79899 Other long term (current) drug therapy: Secondary | ICD-10-CM | POA: Diagnosis not present

## 2020-05-25 DIAGNOSIS — M545 Low back pain: Secondary | ICD-10-CM | POA: Diagnosis not present

## 2020-05-26 DIAGNOSIS — R109 Unspecified abdominal pain: Secondary | ICD-10-CM | POA: Diagnosis not present

## 2020-06-04 DIAGNOSIS — M546 Pain in thoracic spine: Secondary | ICD-10-CM | POA: Diagnosis not present

## 2020-06-09 DIAGNOSIS — S32010A Wedge compression fracture of first lumbar vertebra, initial encounter for closed fracture: Secondary | ICD-10-CM | POA: Diagnosis not present

## 2020-06-09 DIAGNOSIS — S22080A Wedge compression fracture of T11-T12 vertebra, initial encounter for closed fracture: Secondary | ICD-10-CM | POA: Diagnosis not present

## 2020-06-17 DIAGNOSIS — Z950 Presence of cardiac pacemaker: Secondary | ICD-10-CM | POA: Diagnosis not present

## 2020-08-04 DIAGNOSIS — S32010A Wedge compression fracture of first lumbar vertebra, initial encounter for closed fracture: Secondary | ICD-10-CM | POA: Diagnosis not present

## 2020-08-04 DIAGNOSIS — S22080A Wedge compression fracture of T11-T12 vertebra, initial encounter for closed fracture: Secondary | ICD-10-CM | POA: Diagnosis not present

## 2020-08-10 DIAGNOSIS — R634 Abnormal weight loss: Secondary | ICD-10-CM | POA: Diagnosis not present

## 2020-08-10 DIAGNOSIS — Z6820 Body mass index (BMI) 20.0-20.9, adult: Secondary | ICD-10-CM | POA: Diagnosis not present

## 2020-08-17 DIAGNOSIS — E876 Hypokalemia: Secondary | ICD-10-CM | POA: Diagnosis not present

## 2020-08-20 DIAGNOSIS — I255 Ischemic cardiomyopathy: Secondary | ICD-10-CM | POA: Diagnosis not present

## 2020-08-20 DIAGNOSIS — S72002A Fracture of unspecified part of neck of left femur, initial encounter for closed fracture: Secondary | ICD-10-CM | POA: Diagnosis not present

## 2020-08-20 DIAGNOSIS — G301 Alzheimer's disease with late onset: Secondary | ICD-10-CM | POA: Diagnosis not present

## 2020-08-20 DIAGNOSIS — Z950 Presence of cardiac pacemaker: Secondary | ICD-10-CM | POA: Diagnosis not present

## 2020-08-20 DIAGNOSIS — Z7901 Long term (current) use of anticoagulants: Secondary | ICD-10-CM | POA: Diagnosis not present

## 2020-08-20 DIAGNOSIS — I272 Pulmonary hypertension, unspecified: Secondary | ICD-10-CM | POA: Diagnosis not present

## 2020-08-20 DIAGNOSIS — I10 Essential (primary) hypertension: Secondary | ICD-10-CM | POA: Diagnosis not present

## 2020-08-20 DIAGNOSIS — R0781 Pleurodynia: Secondary | ICD-10-CM | POA: Diagnosis not present

## 2020-08-20 DIAGNOSIS — M542 Cervicalgia: Secondary | ICD-10-CM | POA: Diagnosis not present

## 2020-08-20 DIAGNOSIS — I420 Dilated cardiomyopathy: Secondary | ICD-10-CM | POA: Diagnosis not present

## 2020-08-20 DIAGNOSIS — R0902 Hypoxemia: Secondary | ICD-10-CM | POA: Diagnosis not present

## 2020-08-20 DIAGNOSIS — F0390 Unspecified dementia without behavioral disturbance: Secondary | ICD-10-CM | POA: Diagnosis not present

## 2020-08-20 DIAGNOSIS — R519 Headache, unspecified: Secondary | ICD-10-CM | POA: Diagnosis not present

## 2020-08-20 DIAGNOSIS — R52 Pain, unspecified: Secondary | ICD-10-CM | POA: Diagnosis not present

## 2020-08-20 DIAGNOSIS — W19XXXA Unspecified fall, initial encounter: Secondary | ICD-10-CM | POA: Diagnosis not present

## 2020-08-20 DIAGNOSIS — M79652 Pain in left thigh: Secondary | ICD-10-CM | POA: Diagnosis not present

## 2020-08-20 DIAGNOSIS — Z043 Encounter for examination and observation following other accident: Secondary | ICD-10-CM | POA: Diagnosis not present

## 2020-08-20 DIAGNOSIS — S72009A Fracture of unspecified part of neck of unspecified femur, initial encounter for closed fracture: Secondary | ICD-10-CM | POA: Diagnosis not present

## 2020-08-20 DIAGNOSIS — I480 Paroxysmal atrial fibrillation: Secondary | ICD-10-CM | POA: Diagnosis not present

## 2020-08-20 DIAGNOSIS — F0280 Dementia in other diseases classified elsewhere without behavioral disturbance: Secondary | ICD-10-CM | POA: Diagnosis not present

## 2020-08-20 DIAGNOSIS — E782 Mixed hyperlipidemia: Secondary | ICD-10-CM | POA: Diagnosis not present

## 2020-08-21 DIAGNOSIS — Z4789 Encounter for other orthopedic aftercare: Secondary | ICD-10-CM | POA: Diagnosis not present

## 2020-08-21 DIAGNOSIS — S72002A Fracture of unspecified part of neck of left femur, initial encounter for closed fracture: Secondary | ICD-10-CM | POA: Diagnosis not present

## 2020-08-21 DIAGNOSIS — M1612 Unilateral primary osteoarthritis, left hip: Secondary | ICD-10-CM | POA: Diagnosis not present

## 2020-08-21 DIAGNOSIS — I10 Essential (primary) hypertension: Secondary | ICD-10-CM | POA: Diagnosis not present

## 2020-08-21 DIAGNOSIS — R4181 Age-related cognitive decline: Secondary | ICD-10-CM | POA: Diagnosis not present

## 2020-08-21 DIAGNOSIS — W19XXXA Unspecified fall, initial encounter: Secondary | ICD-10-CM | POA: Diagnosis not present

## 2020-08-21 DIAGNOSIS — Z950 Presence of cardiac pacemaker: Secondary | ICD-10-CM | POA: Diagnosis not present

## 2020-08-21 DIAGNOSIS — I110 Hypertensive heart disease with heart failure: Secondary | ICD-10-CM | POA: Diagnosis not present

## 2020-08-21 DIAGNOSIS — M542 Cervicalgia: Secondary | ICD-10-CM | POA: Diagnosis not present

## 2020-08-21 DIAGNOSIS — D62 Acute posthemorrhagic anemia: Secondary | ICD-10-CM | POA: Diagnosis not present

## 2020-08-21 DIAGNOSIS — R0902 Hypoxemia: Secondary | ICD-10-CM | POA: Diagnosis not present

## 2020-08-21 DIAGNOSIS — M12552 Traumatic arthropathy, left hip: Secondary | ICD-10-CM | POA: Diagnosis not present

## 2020-08-21 DIAGNOSIS — Z9181 History of falling: Secondary | ICD-10-CM | POA: Diagnosis not present

## 2020-08-21 DIAGNOSIS — E785 Hyperlipidemia, unspecified: Secondary | ICD-10-CM | POA: Diagnosis not present

## 2020-08-21 DIAGNOSIS — Z7401 Bed confinement status: Secondary | ICD-10-CM | POA: Diagnosis not present

## 2020-08-21 DIAGNOSIS — Z23 Encounter for immunization: Secondary | ICD-10-CM | POA: Diagnosis not present

## 2020-08-21 DIAGNOSIS — F0280 Dementia in other diseases classified elsewhere without behavioral disturbance: Secondary | ICD-10-CM | POA: Diagnosis not present

## 2020-08-21 DIAGNOSIS — N170 Acute kidney failure with tubular necrosis: Secondary | ICD-10-CM | POA: Diagnosis not present

## 2020-08-21 DIAGNOSIS — F0390 Unspecified dementia without behavioral disturbance: Secondary | ICD-10-CM | POA: Diagnosis not present

## 2020-08-21 DIAGNOSIS — R0781 Pleurodynia: Secondary | ICD-10-CM | POA: Diagnosis not present

## 2020-08-21 DIAGNOSIS — Z043 Encounter for examination and observation following other accident: Secondary | ICD-10-CM | POA: Diagnosis not present

## 2020-08-21 DIAGNOSIS — N179 Acute kidney failure, unspecified: Secondary | ICD-10-CM | POA: Diagnosis not present

## 2020-08-21 DIAGNOSIS — K219 Gastro-esophageal reflux disease without esophagitis: Secondary | ICD-10-CM | POA: Diagnosis not present

## 2020-08-21 DIAGNOSIS — S72012A Unspecified intracapsular fracture of left femur, initial encounter for closed fracture: Secondary | ICD-10-CM | POA: Diagnosis not present

## 2020-08-21 DIAGNOSIS — M79652 Pain in left thigh: Secondary | ICD-10-CM | POA: Diagnosis not present

## 2020-08-21 DIAGNOSIS — S72002D Fracture of unspecified part of neck of left femur, subsequent encounter for closed fracture with routine healing: Secondary | ICD-10-CM | POA: Diagnosis not present

## 2020-08-21 DIAGNOSIS — R519 Headache, unspecified: Secondary | ICD-10-CM | POA: Diagnosis not present

## 2020-08-21 DIAGNOSIS — R410 Disorientation, unspecified: Secondary | ICD-10-CM | POA: Diagnosis not present

## 2020-08-21 DIAGNOSIS — G20 Parkinson's disease: Secondary | ICD-10-CM | POA: Diagnosis not present

## 2020-08-21 DIAGNOSIS — I480 Paroxysmal atrial fibrillation: Secondary | ICD-10-CM | POA: Diagnosis not present

## 2020-08-21 DIAGNOSIS — M84652A Pathological fracture in other disease, left femur, initial encounter for fracture: Secondary | ICD-10-CM | POA: Diagnosis not present

## 2020-08-21 DIAGNOSIS — M2550 Pain in unspecified joint: Secondary | ICD-10-CM | POA: Diagnosis not present

## 2020-08-21 DIAGNOSIS — Z96642 Presence of left artificial hip joint: Secondary | ICD-10-CM | POA: Diagnosis not present

## 2020-08-21 DIAGNOSIS — M6281 Muscle weakness (generalized): Secondary | ICD-10-CM | POA: Diagnosis not present

## 2020-08-21 DIAGNOSIS — I509 Heart failure, unspecified: Secondary | ICD-10-CM | POA: Diagnosis not present

## 2020-08-25 DIAGNOSIS — K219 Gastro-esophageal reflux disease without esophagitis: Secondary | ICD-10-CM | POA: Diagnosis not present

## 2020-08-25 DIAGNOSIS — R0689 Other abnormalities of breathing: Secondary | ICD-10-CM | POA: Diagnosis not present

## 2020-08-25 DIAGNOSIS — E860 Dehydration: Secondary | ICD-10-CM | POA: Diagnosis not present

## 2020-08-25 DIAGNOSIS — Z4789 Encounter for other orthopedic aftercare: Secondary | ICD-10-CM | POA: Diagnosis not present

## 2020-08-25 DIAGNOSIS — M47816 Spondylosis without myelopathy or radiculopathy, lumbar region: Secondary | ICD-10-CM | POA: Diagnosis not present

## 2020-08-25 DIAGNOSIS — Z681 Body mass index (BMI) 19 or less, adult: Secondary | ICD-10-CM | POA: Diagnosis not present

## 2020-08-25 DIAGNOSIS — Z9181 History of falling: Secondary | ICD-10-CM | POA: Diagnosis not present

## 2020-08-25 DIAGNOSIS — R404 Transient alteration of awareness: Secondary | ICD-10-CM | POA: Diagnosis not present

## 2020-08-25 DIAGNOSIS — Z7401 Bed confinement status: Secondary | ICD-10-CM | POA: Diagnosis not present

## 2020-08-25 DIAGNOSIS — R0902 Hypoxemia: Secondary | ICD-10-CM | POA: Diagnosis not present

## 2020-08-25 DIAGNOSIS — I959 Hypotension, unspecified: Secondary | ICD-10-CM | POA: Diagnosis not present

## 2020-08-25 DIAGNOSIS — G9340 Encephalopathy, unspecified: Secondary | ICD-10-CM | POA: Diagnosis not present

## 2020-08-25 DIAGNOSIS — N3000 Acute cystitis without hematuria: Secondary | ICD-10-CM | POA: Diagnosis not present

## 2020-08-25 DIAGNOSIS — R410 Disorientation, unspecified: Secondary | ICD-10-CM | POA: Diagnosis not present

## 2020-08-25 DIAGNOSIS — M6281 Muscle weakness (generalized): Secondary | ICD-10-CM | POA: Diagnosis not present

## 2020-08-25 DIAGNOSIS — W19XXXA Unspecified fall, initial encounter: Secondary | ICD-10-CM | POA: Diagnosis not present

## 2020-08-25 DIAGNOSIS — I10 Essential (primary) hypertension: Secondary | ICD-10-CM | POA: Diagnosis not present

## 2020-08-25 DIAGNOSIS — E43 Unspecified severe protein-calorie malnutrition: Secondary | ICD-10-CM | POA: Diagnosis not present

## 2020-08-25 DIAGNOSIS — R6520 Severe sepsis without septic shock: Secondary | ICD-10-CM | POA: Diagnosis not present

## 2020-08-25 DIAGNOSIS — F0280 Dementia in other diseases classified elsewhere without behavioral disturbance: Secondary | ICD-10-CM | POA: Diagnosis not present

## 2020-08-25 DIAGNOSIS — G20 Parkinson's disease: Secondary | ICD-10-CM | POA: Diagnosis not present

## 2020-08-25 DIAGNOSIS — Z23 Encounter for immunization: Secondary | ICD-10-CM | POA: Diagnosis not present

## 2020-08-25 DIAGNOSIS — R0989 Other specified symptoms and signs involving the circulatory and respiratory systems: Secondary | ICD-10-CM | POA: Diagnosis not present

## 2020-08-25 DIAGNOSIS — R062 Wheezing: Secondary | ICD-10-CM | POA: Diagnosis not present

## 2020-08-25 DIAGNOSIS — I119 Hypertensive heart disease without heart failure: Secondary | ICD-10-CM | POA: Diagnosis not present

## 2020-08-25 DIAGNOSIS — S72002A Fracture of unspecified part of neck of left femur, initial encounter for closed fracture: Secondary | ICD-10-CM | POA: Diagnosis not present

## 2020-08-25 DIAGNOSIS — Z950 Presence of cardiac pacemaker: Secondary | ICD-10-CM | POA: Diagnosis not present

## 2020-08-25 DIAGNOSIS — S72002D Fracture of unspecified part of neck of left femur, subsequent encounter for closed fracture with routine healing: Secondary | ICD-10-CM | POA: Diagnosis not present

## 2020-08-25 DIAGNOSIS — M12552 Traumatic arthropathy, left hip: Secondary | ICD-10-CM | POA: Diagnosis not present

## 2020-08-25 DIAGNOSIS — N170 Acute kidney failure with tubular necrosis: Secondary | ICD-10-CM | POA: Diagnosis not present

## 2020-08-25 DIAGNOSIS — G9341 Metabolic encephalopathy: Secondary | ICD-10-CM | POA: Diagnosis not present

## 2020-08-25 DIAGNOSIS — E870 Hyperosmolality and hypernatremia: Secondary | ICD-10-CM | POA: Diagnosis not present

## 2020-08-25 DIAGNOSIS — F0390 Unspecified dementia without behavioral disturbance: Secondary | ICD-10-CM | POA: Diagnosis not present

## 2020-08-25 DIAGNOSIS — M2550 Pain in unspecified joint: Secondary | ICD-10-CM | POA: Diagnosis not present

## 2020-08-25 DIAGNOSIS — M4186 Other forms of scoliosis, lumbar region: Secondary | ICD-10-CM | POA: Diagnosis not present

## 2020-08-25 DIAGNOSIS — D62 Acute posthemorrhagic anemia: Secondary | ICD-10-CM | POA: Diagnosis not present

## 2020-08-25 DIAGNOSIS — E785 Hyperlipidemia, unspecified: Secondary | ICD-10-CM | POA: Diagnosis not present

## 2020-08-25 DIAGNOSIS — R4181 Age-related cognitive decline: Secondary | ICD-10-CM | POA: Diagnosis not present

## 2020-08-25 DIAGNOSIS — I110 Hypertensive heart disease with heart failure: Secondary | ICD-10-CM | POA: Diagnosis not present

## 2020-08-25 DIAGNOSIS — A419 Sepsis, unspecified organism: Secondary | ICD-10-CM | POA: Diagnosis not present

## 2020-08-25 DIAGNOSIS — I700 Atherosclerosis of aorta: Secondary | ICD-10-CM | POA: Diagnosis not present

## 2020-08-26 DIAGNOSIS — S72002D Fracture of unspecified part of neck of left femur, subsequent encounter for closed fracture with routine healing: Secondary | ICD-10-CM | POA: Diagnosis not present

## 2020-08-26 DIAGNOSIS — Z950 Presence of cardiac pacemaker: Secondary | ICD-10-CM | POA: Diagnosis not present

## 2020-08-26 DIAGNOSIS — F0390 Unspecified dementia without behavioral disturbance: Secondary | ICD-10-CM | POA: Diagnosis not present

## 2020-08-26 DIAGNOSIS — I119 Hypertensive heart disease without heart failure: Secondary | ICD-10-CM | POA: Diagnosis not present

## 2020-09-04 DIAGNOSIS — Z950 Presence of cardiac pacemaker: Secondary | ICD-10-CM | POA: Diagnosis not present

## 2020-09-04 DIAGNOSIS — S72002D Fracture of unspecified part of neck of left femur, subsequent encounter for closed fracture with routine healing: Secondary | ICD-10-CM | POA: Diagnosis not present

## 2020-09-04 DIAGNOSIS — F0390 Unspecified dementia without behavioral disturbance: Secondary | ICD-10-CM | POA: Diagnosis not present

## 2020-09-04 DIAGNOSIS — I119 Hypertensive heart disease without heart failure: Secondary | ICD-10-CM | POA: Diagnosis not present

## 2020-09-07 DIAGNOSIS — Z950 Presence of cardiac pacemaker: Secondary | ICD-10-CM | POA: Diagnosis not present

## 2020-09-07 DIAGNOSIS — G9341 Metabolic encephalopathy: Secondary | ICD-10-CM | POA: Diagnosis not present

## 2020-09-07 DIAGNOSIS — R404 Transient alteration of awareness: Secondary | ICD-10-CM | POA: Diagnosis not present

## 2020-09-07 DIAGNOSIS — E43 Unspecified severe protein-calorie malnutrition: Secondary | ICD-10-CM | POA: Diagnosis not present

## 2020-09-07 DIAGNOSIS — R410 Disorientation, unspecified: Secondary | ICD-10-CM | POA: Diagnosis not present

## 2020-09-07 DIAGNOSIS — I700 Atherosclerosis of aorta: Secondary | ICD-10-CM | POA: Diagnosis not present

## 2020-09-07 DIAGNOSIS — E860 Dehydration: Secondary | ICD-10-CM | POA: Diagnosis not present

## 2020-09-07 DIAGNOSIS — E870 Hyperosmolality and hypernatremia: Secondary | ICD-10-CM | POA: Diagnosis not present

## 2020-09-07 DIAGNOSIS — N3000 Acute cystitis without hematuria: Secondary | ICD-10-CM | POA: Diagnosis not present

## 2020-09-07 DIAGNOSIS — K219 Gastro-esophageal reflux disease without esophagitis: Secondary | ICD-10-CM | POA: Diagnosis not present

## 2020-09-07 DIAGNOSIS — M47816 Spondylosis without myelopathy or radiculopathy, lumbar region: Secondary | ICD-10-CM | POA: Diagnosis not present

## 2020-09-07 DIAGNOSIS — A419 Sepsis, unspecified organism: Secondary | ICD-10-CM | POA: Diagnosis not present

## 2020-09-07 DIAGNOSIS — I959 Hypotension, unspecified: Secondary | ICD-10-CM | POA: Diagnosis not present

## 2020-09-07 DIAGNOSIS — R0689 Other abnormalities of breathing: Secondary | ICD-10-CM | POA: Diagnosis not present

## 2020-09-07 DIAGNOSIS — N170 Acute kidney failure with tubular necrosis: Secondary | ICD-10-CM | POA: Diagnosis not present

## 2020-09-07 DIAGNOSIS — R0989 Other specified symptoms and signs involving the circulatory and respiratory systems: Secondary | ICD-10-CM | POA: Diagnosis not present

## 2020-09-07 DIAGNOSIS — M4186 Other forms of scoliosis, lumbar region: Secondary | ICD-10-CM | POA: Diagnosis not present

## 2020-09-07 DIAGNOSIS — Z7401 Bed confinement status: Secondary | ICD-10-CM | POA: Diagnosis not present

## 2020-09-07 DIAGNOSIS — R6520 Severe sepsis without septic shock: Secondary | ICD-10-CM | POA: Diagnosis not present

## 2020-09-07 DIAGNOSIS — R0902 Hypoxemia: Secondary | ICD-10-CM | POA: Diagnosis not present

## 2020-09-07 DIAGNOSIS — Z681 Body mass index (BMI) 19 or less, adult: Secondary | ICD-10-CM | POA: Diagnosis not present

## 2020-09-07 DIAGNOSIS — Z4682 Encounter for fitting and adjustment of non-vascular catheter: Secondary | ICD-10-CM | POA: Diagnosis not present

## 2020-09-07 DIAGNOSIS — G9340 Encephalopathy, unspecified: Secondary | ICD-10-CM | POA: Diagnosis not present

## 2020-09-07 DIAGNOSIS — R062 Wheezing: Secondary | ICD-10-CM | POA: Diagnosis not present

## 2020-09-25 ENCOUNTER — Other Ambulatory Visit: Payer: Self-pay | Admitting: *Deleted

## 2020-09-25 DIAGNOSIS — I712 Thoracic aortic aneurysm, without rupture, unspecified: Secondary | ICD-10-CM

## 2020-09-25 DIAGNOSIS — F0390 Unspecified dementia without behavioral disturbance: Secondary | ICD-10-CM | POA: Diagnosis not present

## 2020-09-25 DIAGNOSIS — N390 Urinary tract infection, site not specified: Secondary | ICD-10-CM | POA: Diagnosis not present

## 2020-09-25 DIAGNOSIS — L8992 Pressure ulcer of unspecified site, stage 2: Secondary | ICD-10-CM | POA: Diagnosis not present

## 2020-09-25 DIAGNOSIS — R339 Retention of urine, unspecified: Secondary | ICD-10-CM | POA: Diagnosis not present

## 2020-10-02 DIAGNOSIS — E43 Unspecified severe protein-calorie malnutrition: Secondary | ICD-10-CM | POA: Diagnosis not present

## 2020-10-02 DIAGNOSIS — F0390 Unspecified dementia without behavioral disturbance: Secondary | ICD-10-CM | POA: Diagnosis not present

## 2020-10-02 DIAGNOSIS — N1831 Chronic kidney disease, stage 3a: Secondary | ICD-10-CM | POA: Diagnosis not present

## 2020-10-02 DIAGNOSIS — I119 Hypertensive heart disease without heart failure: Secondary | ICD-10-CM | POA: Diagnosis not present

## 2020-11-06 DIAGNOSIS — I119 Hypertensive heart disease without heart failure: Secondary | ICD-10-CM | POA: Diagnosis not present

## 2020-11-06 DIAGNOSIS — R1310 Dysphagia, unspecified: Secondary | ICD-10-CM | POA: Diagnosis not present

## 2020-11-06 DIAGNOSIS — N1831 Chronic kidney disease, stage 3a: Secondary | ICD-10-CM | POA: Diagnosis not present

## 2020-11-06 DIAGNOSIS — F0390 Unspecified dementia without behavioral disturbance: Secondary | ICD-10-CM | POA: Diagnosis not present

## 2020-11-10 ENCOUNTER — Encounter: Payer: Medicare PPO | Admitting: Thoracic Surgery (Cardiothoracic Vascular Surgery)

## 2020-11-10 ENCOUNTER — Other Ambulatory Visit: Payer: Medicare PPO

## 2020-12-10 DIAGNOSIS — F0390 Unspecified dementia without behavioral disturbance: Secondary | ICD-10-CM | POA: Diagnosis not present

## 2020-12-10 DIAGNOSIS — E43 Unspecified severe protein-calorie malnutrition: Secondary | ICD-10-CM | POA: Diagnosis not present

## 2020-12-10 DIAGNOSIS — L8992 Pressure ulcer of unspecified site, stage 2: Secondary | ICD-10-CM | POA: Diagnosis not present

## 2020-12-10 DIAGNOSIS — N1831 Chronic kidney disease, stage 3a: Secondary | ICD-10-CM | POA: Diagnosis not present

## 2021-01-12 DIAGNOSIS — F0390 Unspecified dementia without behavioral disturbance: Secondary | ICD-10-CM | POA: Diagnosis not present

## 2021-01-12 DIAGNOSIS — R339 Retention of urine, unspecified: Secondary | ICD-10-CM | POA: Diagnosis not present

## 2021-01-12 DIAGNOSIS — E43 Unspecified severe protein-calorie malnutrition: Secondary | ICD-10-CM | POA: Diagnosis not present

## 2021-01-12 DIAGNOSIS — N1831 Chronic kidney disease, stage 3a: Secondary | ICD-10-CM | POA: Diagnosis not present

## 2021-02-11 DIAGNOSIS — E43 Unspecified severe protein-calorie malnutrition: Secondary | ICD-10-CM | POA: Diagnosis not present

## 2021-02-11 DIAGNOSIS — F0390 Unspecified dementia without behavioral disturbance: Secondary | ICD-10-CM | POA: Diagnosis not present

## 2021-02-11 DIAGNOSIS — Z515 Encounter for palliative care: Secondary | ICD-10-CM | POA: Diagnosis not present

## 2021-03-22 DIAGNOSIS — N1831 Chronic kidney disease, stage 3a: Secondary | ICD-10-CM | POA: Diagnosis not present

## 2021-03-22 DIAGNOSIS — Z515 Encounter for palliative care: Secondary | ICD-10-CM | POA: Diagnosis not present

## 2021-03-22 DIAGNOSIS — E43 Unspecified severe protein-calorie malnutrition: Secondary | ICD-10-CM | POA: Diagnosis not present

## 2021-03-22 DIAGNOSIS — F0390 Unspecified dementia without behavioral disturbance: Secondary | ICD-10-CM | POA: Diagnosis not present

## 2021-04-16 DEATH — deceased
